# Patient Record
Sex: Female | Born: 1970 | Race: White | Hispanic: No | Marital: Married | Smoking: Former smoker
Health system: Southern US, Community
[De-identification: ages and names within clinical notes are randomized; demographics above are authoritative.]

## PROBLEM LIST (undated history)

## (undated) DIAGNOSIS — R2689 Other abnormalities of gait and mobility: Secondary | ICD-10-CM

## (undated) DIAGNOSIS — K8020 Calculus of gallbladder without cholecystitis without obstruction: Secondary | ICD-10-CM

---

## 2009-05-24 DIAGNOSIS — R2689 Other abnormalities of gait and mobility: Secondary | ICD-10-CM

## 2009-05-24 HISTORY — PX: FOOT SURGERY: SHX648

## 2009-05-24 HISTORY — DX: Other abnormalities of gait and mobility: R26.89

## 2014-03-14 ENCOUNTER — Encounter: Payer: Self-pay | Admitting: Family Medicine

## 2014-03-14 ENCOUNTER — Ambulatory Visit (INDEPENDENT_AMBULATORY_CARE_PROVIDER_SITE_OTHER): Payer: BC Managed Care – PPO | Admitting: Family Medicine

## 2014-03-14 VITALS — BP 114/74 | HR 66 | Ht 62.0 in | Wt 262.0 lb

## 2014-03-14 DIAGNOSIS — R635 Abnormal weight gain: Secondary | ICD-10-CM

## 2014-03-14 DIAGNOSIS — E669 Obesity, unspecified: Secondary | ICD-10-CM

## 2014-03-14 LAB — BASIC METABOLIC PANEL WITH GFR
BUN: 9 mg/dL (ref 6–23)
CALCIUM: 9.1 mg/dL (ref 8.4–10.5)
CHLORIDE: 102 meq/L (ref 96–112)
CO2: 29 meq/L (ref 19–32)
CREATININE: 0.61 mg/dL (ref 0.50–1.10)
GFR, Est African American: >89 mL/min
GFR, Est Non African American: >89 mL/min
Glucose, Bld: 84 mg/dL (ref 70–99)
Potassium: 4.8 mEq/L (ref 3.5–5.3)
SODIUM: 137 meq/L (ref 135–145)

## 2014-03-14 LAB — TSH: TSH: 2.049 u[IU]/mL (ref 0.350–4.500)

## 2014-03-14 MED ORDER — PHENTERMINE HCL 37.5 MG PO TABS: 37.5000 mg | ORAL_TABLET | Freq: Every day | ORAL | Status: DC

## 2014-03-14 NOTE — Patient Instructions (Signed)
Other weight loss medications include: Belviq, Qsymia, Contrave, Topamax (used only because appetite loss is the #1 side effect).

## 2014-03-14 NOTE — Progress Notes (Signed)
CC: Nancy Cannon Cannon is a 43 y.o. female is here for Establish Care   Subjective: HPI:  Pleasant 43 year old here to establish care wife of Nancy Cannon Cannon  Patient reports difficulty with losing weight over the past 3-4 years ever since a right ankle fracture left her immobilized for a few months. She's actually had some success in September citing that she's lost about 20 pounds. She has a sedentary job and has not been able to find time during the day to start a formal exercise routine. She's lost weight by using plexus.  Her mother and father is and was overweight respectively.  She's never had her thyroid function tested and she can't recall when she had blood work done last.  She reports that cravings are the major problem her difficulty losing weight.  Review of Systems - General ROS: negative for - chills, fever, night sweats, unintentional weight loss Ophthalmic ROS: negative for - decreased vision Psychological ROS: negative for - anxiety or depression ENT ROS: negative for - hearing change, nasal congestion, tinnitus or allergies Hematological and Lymphatic ROS: negative for - bleeding problems, bruising or swollen lymph nodes Breast ROS: negative Respiratory ROS: no cough, shortness of breath, or wheezing Cardiovascular ROS: no chest pain or dyspnea on exertion Gastrointestinal ROS: no abdominal pain, change in bowel habits, or black or bloody stools Genito-Urinary ROS: negative for - genital discharge, genital ulcers, incontinence or abnormal bleeding from genitals Musculoskeletal ROS: negative for - joint pain or muscle pain Neurological ROS: negative for - headaches or memory loss Dermatological ROS: negative for lumps, mole changes, rash and skin lesion changes  History reviewed. No pertinent past medical history.  Past Surgical History  Procedure Laterality Date  . Foot surgery  2011   History reviewed. No pertinent family history.  History   Social History  . Marital  Status: Married    Spouse Name: N/A    Number of Children: N/A  . Years of Education: N/A   Occupational History  . Not on file.   Social History Main Topics  . Smoking status: Never Smoker   . Smokeless tobacco: Not on file  . Alcohol Use: Yes  . Drug Use: No  . Sexual Activity: Yes    Partners: Male   Other Topics Concern  . Not on file   Social History Narrative  . No narrative on file     Objective: BP 114/74  Pulse 66  Ht 5\' 2"  (1.575 m)  Wt 262 lb (118.842 kg)  BMI 47.91 kg/m2  General: Alert and Oriented, No Acute Distress HEENT: Pupils equal, round, reactive to light. Conjunctivae clear.  Moist mucous membranes pharynx unremarkable Lungs: Clear to auscultation bilaterally, no wheezing/ronchi/rales.  Comfortable work of breathing. Good air movement. Cardiac: Regular rate and rhythm. Normal S1/S2.  No murmurs, rubs, nor gallops.   Abdomen: Obese and soft Extremities: No peripheral edema.  Strong peripheral pulses.  Mental Status: No depression, anxiety, nor agitation. Skin: Warm and dry.  Assessment & Plan: Nancy Cannon "Nancy Cannon Cannon" was seen today for establish care.  Diagnoses and associated orders for this visit:  Obesity - TSH - BASIC METABOLIC PANEL WITH GFR - phentermine (ADIPEX-P) 37.5 MG tablet; Take 1 tablet (37.5 mg total) by mouth daily before breakfast. - Ambulatory referral to diabetic education  Abnormal weight gain - TSH - BASIC METABOLIC PANEL WITH GFR - Ambulatory referral to diabetic education    Obesity: Uncontrolled ruling out metabolic abnormality and thyroid dysfunction. She expresses interest in starting phentermine and  see a nutritionist. Referral has been placed. Advised that she'll need to followup monthly for consideration of phentermine refills. Discussed possible side effects of phentermine. Also provided her with the names of other medications that she could consider using in the future if desired.   Return in about 4 weeks (around  04/11/2014) for BP Weight Check.

## 2014-04-15 ENCOUNTER — Ambulatory Visit (INDEPENDENT_AMBULATORY_CARE_PROVIDER_SITE_OTHER): Payer: BC Managed Care – PPO | Admitting: Family Medicine

## 2014-04-15 ENCOUNTER — Encounter: Payer: Self-pay | Admitting: Family Medicine

## 2014-04-15 VITALS — BP 112/78 | HR 83 | Wt 243.0 lb

## 2014-04-15 DIAGNOSIS — J309 Allergic rhinitis, unspecified: Secondary | ICD-10-CM

## 2014-04-15 DIAGNOSIS — R635 Abnormal weight gain: Secondary | ICD-10-CM

## 2014-04-15 DIAGNOSIS — E669 Obesity, unspecified: Secondary | ICD-10-CM | POA: Diagnosis not present

## 2014-04-15 MED ORDER — PHENTERMINE HCL 37.5 MG PO TABS: 37.5000 mg | ORAL_TABLET | Freq: Every day | ORAL | Status: DC

## 2014-04-15 NOTE — Progress Notes (Signed)
CC: Nancy Cannon is a 43 y.o. female is here for Obesity   Subjective: HPI:  Follow-up obesity: She is following a diet called "the whole 30 diet" that sounds like it's a diet heavily focused on reducing carbohydrates. She's been successful with over 20 pound weight loss using this diet and phentermine. No formal physical activity regimen. She denies any known side effects due to the use of phentermine. She believes providing her benefit with appetite suppression. She would like refills of possible.  Complains of nasal congestion and postnasal drip mild in severity that began yesterday. Other particular makes it better or worse. No interventions as of yet. She does think it is better this morning however. She denies fevers, chills, cough, shortness of breath  Review Of Systems Outlined In HPI  No past medical history on file.  Past Surgical History  Procedure Laterality Date  . Foot surgery  2011   Family History  Problem Relation Age of Onset  . Pancreatic cancer Father     deceased  . Hypertension Mother     History   Social History  . Marital Status: Married    Spouse Name: N/A    Number of Children: N/A  . Years of Education: N/A   Occupational History  . Not on file.   Social History Main Topics  . Smoking status: Former Smoker    Quit date: 12/12/2009  . Smokeless tobacco: Not on file  . Alcohol Use: Yes  . Drug Use: No  . Sexual Activity:    Partners: Male   Other Topics Concern  . Not on file   Social History Narrative  . No narrative on file     Objective: BP 112/78 mmHg  Pulse 83  Wt 243 lb (110.224 kg)  SpO2 97%  General: Alert and Oriented, No Acute Distress HEENT: Pupils equal, round, reactive to light. Conjunctivae clear.  External ears unremarkable, canals clear with intact TMs with appropriate landmarks.  Middle ear appears open without effusion. Pink inferior turbinates.  Moist mucous membranes, pharynx without inflammation nor  lesions.  Neck supple without palpable lymphadenopathy nor abnormal masses. Abdomen: Obese and soft Extremities: No peripheral edema.  Strong peripheral pulses.  Mental Status: No depression, anxiety, nor agitation. Skin: Warm and dry.  Assessment & Plan: Nancy Cannon was seen today for obesity.  Diagnoses and associated orders for this visit:  Abnormal weight gain  Obesity - phentermine (ADIPEX-P) 37.5 MG tablet; Take 1 tablet (37.5 mg total) by mouth daily before breakfast.  Allergic sinusitis    Obesity: Improving continue phentermine, applauded her success with dietary interventions. Discussed benefits of increasing physical activity Allergic sinusitis: Discussed antihistamines, nasal saline washes, and to call me if she begins to develop fevers, chills, night sweats.  Return in about 4 weeks (around 05/13/2014) for Nurse visit for Blood pressure weight check.

## 2014-04-25 ENCOUNTER — Encounter: Payer: Self-pay | Admitting: *Deleted

## 2014-04-25 ENCOUNTER — Encounter: Payer: BC Managed Care – PPO | Attending: Family Medicine | Admitting: *Deleted

## 2014-04-25 DIAGNOSIS — Z713 Dietary counseling and surveillance: Secondary | ICD-10-CM | POA: Insufficient documentation

## 2014-04-25 DIAGNOSIS — E669 Obesity, unspecified: Secondary | ICD-10-CM | POA: Insufficient documentation

## 2014-04-25 DIAGNOSIS — Z6841 Body Mass Index (BMI) 40.0 and over, adult: Secondary | ICD-10-CM | POA: Diagnosis not present

## 2014-04-25 NOTE — Progress Notes (Signed)
Medical Nutrition Therapy:  Appt start time: 0800 end time:  0900.  Assessment:  Primary concerns today: Nancy Cannon is here with her husband, Gerald Stabs, for nutrition counseling pertaining to weight management.  She has lost 20 pounds following the "total 30 diet"  She has cut out all sugars, starches, dairy, (most fruits except grapes by choice).  The diet sounds very similar to Paleo.  She realizes this is not sustainable.  Husband has diabetes and would like to make changes for himself.   They share grocery shopping responsibilities and cooking responsibilities.  Currently [place] the cooking and uses olive oil on the stove mostly.  She states she doesn't really bake that much.  They have totally changed their eating habits in the past 4 months.  They are from heavy families and are used to eating large portions of energy-dense foods.  They used to eat a lot of sweets, but not as much anymore.  They currently eat out 2-3 times.  She broke her foot and she had to have her foot rebuilt.  She was not able to be active for 6 months and they both gained a lot of weight during that process.   She has lost 40 pounds since September 1.   Her highest weight was 276 pounds (4 months ago) and her lowest weight as an adult was 115 pounds.  She states she has never had a consistent weight and she has yo-yoed her entire adult life.  She has done [place], exercising excessively and was on a really restrictive diet.  She would like to weigh 130.  Her mom and her sister are both heavy.  She does not know what she would weigh is she didn't diet.  Husband reports she has poor sleep habits: she wakes multiple times in the night, snores, talks in her sleep, and wakes in the morning still tired   Preferred Learning Style:   No preference indicated   Learning Readiness:   Change in progress   MEDICATIONS: phentermine   DIETARY INTAKE:  Usual eating pattern includes 1-3 meals and 1-2 snacks per day.  Everyday foods  include protein, vegetables, fats.  Avoided foods include carbohydrates.    24-hr recall:  B ( AM): grapes  Snk ( AM): almonds  L ( PM): salad (lettuce, guac, ham, oil and vinegar) Snk ( PM): grapes, almonds D ( PM): nothing Snk ( PM): not usually Beverages: water, Plexus (weight loss shake)  Usual physical activity: none currently  Estimated energy needs: 1800 calories 200 g carbohydrates 135 g protein 50 g fat    Nutritional Diagnosis:  NI-5.11.1 Predicted suboptimal nutrient intake As related to very restrictive diet.  As evidenced by dietary recall.    Intervention:  Nutrition counseling provided.  Discussed metabolic effects of dieting and discouraged this practice.   Discussed effects of pooor sleep and recommended talking with provider about sleep study.   Encouraged patient to reject traditional diet mentality of "good" vs "bad" foods.  There are no good and bad foods, but rather food is fuel that we needs for our bodies.  When we don't get enough fuel, our bodies suffer the metabolic consequences.  Encouraged patient to eat whatever foods will satisfy them, regardless of their nutritional value.  We will discuss nutritional values of foods at a subsequent appointment.  Encouraged patient to honor their body's internal hunger and fullness cues.  Throughout the day, check in mentally and rate hunger.  Try not to eat when ravenous, but instead  when slightly hungry.  Then choose food(s) that will be satisfying regardless of nutritional content.  Sit down to enjoy those foods.  Minimize distractions: turn off tv, put away books, work, Brewing technologist.  Make the meal last at least 20 minutes in order to give time to experience and register satiety.  Stop eating when full regardless of how much food is left on the plate.  Get more if still hungry.  The key is to honor fullness so throughout the meal, rate fullness factor and stop when comfortably full, but not stuffed.  Reminded patient that  they can have any food they want, whenever they want, and however much they want.  Eventually the novelty will wear out and each food will be equal in terms of its emotional appeal.  This will be a learning process and some days more food will be eaten, some days less.  The key is to honor hunger and fullness without any feelings of guilt.  Pay attention to what the internal cues are, rather than any external factors.  Spoke briefly on MyPlate for diabetes recommendations for her husband's benefit.  Suggested he ask his doctor for a referral for diabetes education  Goals: Put away the scale.  Refrain from weighing self between nutrition appointments Reject diet mentality- there are no good or bad foods Listen to internal hunger cues and honor those cues; don't wait until you're ravenous to eat Choose the food(s) you want Enjoy those foods.  Try to make meal last 20 minutes Stop eating when full.  Honor fullness cues too Do these things without any guilt or regret Talk with doctor about sleep study   Teaching Method Utilized:  Visual Auditory   Handouts given during visit include:  MyPlate for diabetes  Hunger scale  Barriers to learning/adherence to lifestyle change: diet mentality  Demonstrated degree of understanding via:  Teach Back   Monitoring/Evaluation:  Dietary intake, exercise, and body weight in 4-6 week(s).

## 2014-05-15 ENCOUNTER — Ambulatory Visit (INDEPENDENT_AMBULATORY_CARE_PROVIDER_SITE_OTHER): Payer: BC Managed Care – PPO | Admitting: Family Medicine

## 2014-05-15 VITALS — BP 114/82 | HR 70 | Wt 232.0 lb

## 2014-05-15 DIAGNOSIS — E669 Obesity, unspecified: Secondary | ICD-10-CM

## 2014-05-15 DIAGNOSIS — R635 Abnormal weight gain: Secondary | ICD-10-CM

## 2014-05-15 MED ORDER — PHENTERMINE HCL 37.5 MG PO TABS: 37.5000 mg | ORAL_TABLET | Freq: Every day | ORAL | Status: DC

## 2014-05-15 NOTE — Progress Notes (Signed)
   Subjective:    Patient ID: Nancy Cannon, female    DOB: 1970/11/24, 43 y.o.   MRN: [id number]  HPI  Jeremy is here for a weight and blood pressure check. Denies palpitations and insomnia.   Review of Systems     Objective:   Physical Exam        Assessment & Plan:  Obesity - Patient has lost weight. A refill of phentermine will be faxed to [facility]. Patient advised to schedule follow up nurse visit for blood pressure and weight check in 30 days.

## 2014-05-15 NOTE — Progress Notes (Signed)
She continues to lose weight with an acceptable blood pressure. Refills for phentermine provided.

## 2014-06-03 ENCOUNTER — Ambulatory Visit: Payer: BC Managed Care – PPO | Admitting: *Deleted

## 2014-06-13 ENCOUNTER — Ambulatory Visit (INDEPENDENT_AMBULATORY_CARE_PROVIDER_SITE_OTHER): Payer: BLUE CROSS/BLUE SHIELD | Admitting: Family Medicine

## 2014-06-13 ENCOUNTER — Other Ambulatory Visit: Payer: Self-pay | Admitting: *Deleted

## 2014-06-13 VITALS — BP 107/67 | HR 76 | Ht 62.0 in | Wt 227.0 lb

## 2014-06-13 DIAGNOSIS — E669 Obesity, unspecified: Secondary | ICD-10-CM

## 2014-06-13 DIAGNOSIS — R635 Abnormal weight gain: Secondary | ICD-10-CM

## 2014-06-13 MED ORDER — PHENTERMINE HCL 37.5 MG PO TABS: 37.5000 mg | ORAL_TABLET | Freq: Every day | ORAL | Status: DC

## 2014-06-13 NOTE — Progress Notes (Signed)
   Subjective:    Patient ID: Nancy Cannon, female    DOB: 10/10/1970, 44 y.o.   MRN: [id number]  HPI  Nancy Cannon comes to office today for medication refill. She has most recently lost 5lbs this last month. She denies CP, SOB or headaches. No other problems at this time.    Review of Systems     Objective:   Physical Exam        Assessment & Plan:

## 2014-07-15 ENCOUNTER — Encounter: Payer: Self-pay | Admitting: Family Medicine

## 2014-07-15 ENCOUNTER — Ambulatory Visit (INDEPENDENT_AMBULATORY_CARE_PROVIDER_SITE_OTHER): Payer: BLUE CROSS/BLUE SHIELD | Admitting: Family Medicine

## 2014-07-15 VITALS — BP 120/72 | HR 77 | Ht 62.0 in | Wt 215.0 lb

## 2014-07-15 DIAGNOSIS — R635 Abnormal weight gain: Secondary | ICD-10-CM | POA: Diagnosis not present

## 2014-07-15 DIAGNOSIS — E669 Obesity, unspecified: Secondary | ICD-10-CM | POA: Diagnosis not present

## 2014-07-15 MED ORDER — PHENTERMINE HCL 37.5 MG PO TABS
37.5000 mg | ORAL_TABLET | Freq: Every day | ORAL | Status: DC
Start: 2014-07-15 — End: 2014-08-13

## 2014-07-15 NOTE — Progress Notes (Signed)
CC: Nancy Cannon is a 44 y.o. female is here for Follow-up   Subjective: HPI:  Follow-up obesity: She is cutting out snacks in her diet, she is exercising on a treadmill 2-3 times a week. She feels phentermine is still helping with portion control. She denies any known side effects. Denies chest pain or irregular heartbeat, anxiety, or sleep disturbance. She feels a difference on the days that she does not take medication.  She is noticing that she is losing weight with respect to her clothing and also objectively on the scale.   Review Of Systems Outlined In HPI  No past medical history on file.  Past Surgical History  Procedure Laterality Date  . Foot surgery  2011   Family History  Problem Relation Age of Onset  . Pancreatic cancer Father     deceased  . Hypertension Mother     History   Social History  . Marital Status: Married    Spouse Name: N/A  . Number of Children: N/A  . Years of Education: N/A   Occupational History  . Not on file.   Social History Main Topics  . Smoking status: Former Smoker    Quit date: 12/12/2009  . Smokeless tobacco: Not on file  . Alcohol Use: Yes  . Drug Use: No  . Sexual Activity:    Partners: Male   Other Topics Concern  . Not on file   Social History Narrative     Objective: BP 120/72 mmHg  Pulse 77  Ht 5\' 2"  (1.575 m)  Wt 215 lb (97.523 kg)  BMI 39.31 kg/m2  General: Alert and Oriented, No Acute Distress HEENT: Pupils equal, round, reactive to light. Conjunctivae clear.  Moist mucous membranes Lungs: Clear to auscultation bilaterally, no wheezing/ronchi/rales.  Comfortable work of breathing. Good air movement. Cardiac: Regular rate and rhythm. Normal S1/S2.  No murmurs, rubs, nor gallops.   Abdomen: Obese and soft Extremities: No peripheral edema.  Strong peripheral pulses.  Mental Status: No depression, anxiety, nor agitation. Skin: Warm and dry.  Assessment & Plan: [name] was seen today for  follow-up.  Diagnoses and all orders for this visit:  Abnormal weight gain  Obesity Orders: -     phentermine (ADIPEX-P) 37.5 MG tablet; Take 1 tablet (37.5 mg total) by mouth daily before breakfast.   Obesity: Improving congratulated her success, emphasized the importance of exercising most days of the week to help keep weight off when she plateaus. Continue phentermine.  Return in about 4 weeks (around 08/12/2014).

## 2014-08-13 ENCOUNTER — Other Ambulatory Visit: Payer: Self-pay | Admitting: Family Medicine

## 2014-08-13 ENCOUNTER — Ambulatory Visit (INDEPENDENT_AMBULATORY_CARE_PROVIDER_SITE_OTHER): Payer: BLUE CROSS/BLUE SHIELD | Admitting: Family Medicine

## 2014-08-13 VITALS — BP 120/88 | HR 75 | Ht 62.0 in | Wt 209.0 lb

## 2014-08-13 DIAGNOSIS — R635 Abnormal weight gain: Secondary | ICD-10-CM | POA: Diagnosis not present

## 2014-08-13 DIAGNOSIS — E669 Obesity, unspecified: Secondary | ICD-10-CM | POA: Diagnosis not present

## 2014-08-13 MED ORDER — PHENTERMINE HCL 37.5 MG PO TABS: 37.5000 mg | ORAL_TABLET | Freq: Every day | ORAL | Status: DC

## 2014-08-13 NOTE — Progress Notes (Signed)
   Subjective:    Patient ID: Nancy Cannon, female    DOB: Sep 05, 1970, 44 y.o.   MRN: [id number]  HPI Patient is here for blood pressure and weight check. Denies any trouble sleeping, palpitations, or any other medication problems. Patient states she has not started working out at the gym yet, but has plans to. Patient has made some dietary changes along with her Rx for Phentermine.    Review of Systems     Objective:   Physical Exam        Assessment & Plan:  Patient has lost weight. A refill for Phentermine will be sent to patient preferred pharmacy. Patient advised to schedule a four week nurse visit and keep her upcoming appointment with her PCP. Verbalized understanding, no further questions.

## 2014-08-13 NOTE — Progress Notes (Signed)
Continues to lose weight, Rx refilled.

## 2014-09-13 ENCOUNTER — Ambulatory Visit (INDEPENDENT_AMBULATORY_CARE_PROVIDER_SITE_OTHER): Payer: BLUE CROSS/BLUE SHIELD | Admitting: Physician Assistant

## 2014-09-13 VITALS — BP 114/82 | HR 72 | Ht 62.0 in | Wt 198.0 lb

## 2014-09-13 DIAGNOSIS — E669 Obesity, unspecified: Secondary | ICD-10-CM | POA: Diagnosis not present

## 2014-09-13 DIAGNOSIS — I10 Essential (primary) hypertension: Secondary | ICD-10-CM

## 2014-09-13 MED ORDER — PHENTERMINE HCL 37.5 MG PO TABS: 37.5000 mg | ORAL_TABLET | Freq: Every day | ORAL | Status: DC

## 2014-09-13 NOTE — Progress Notes (Signed)
   Subjective:    Patient ID: Nancy Cannon, female    DOB: 12-02-1970, 44 y.o.   MRN: [id number]  HPI Patient is here for blood pressure and weight check. Denies any trouble sleeping, palpitations, or any other medication problems.    Review of Systems     Objective:   Physical Exam        Assessment & Plan:  Patient has lost weight. A refill for Phentermine will be sent to patient preferred pharmacy. Patient advised to schedule a four week nurse visit and keep her upcoming appointment with her PCP. Verbalized understanding, no further questions.

## 2014-09-30 ENCOUNTER — Other Ambulatory Visit: Payer: Self-pay | Admitting: Family Medicine

## 2014-10-11 ENCOUNTER — Ambulatory Visit: Payer: BLUE CROSS/BLUE SHIELD | Admitting: Family Medicine

## 2014-11-21 ENCOUNTER — Ambulatory Visit (INDEPENDENT_AMBULATORY_CARE_PROVIDER_SITE_OTHER): Payer: BLUE CROSS/BLUE SHIELD | Admitting: Family Medicine

## 2014-11-21 ENCOUNTER — Encounter: Payer: Self-pay | Admitting: Family Medicine

## 2014-11-21 VITALS — BP 102/75 | HR 60 | Wt 182.0 lb

## 2014-11-21 DIAGNOSIS — R1011 Right upper quadrant pain: Secondary | ICD-10-CM

## 2014-11-21 LAB — COMPLETE METABOLIC PANEL WITH GFR
ALBUMIN: 4 g/dL (ref 3.5–5.2)
ALK PHOS: 250 U/L — AB (ref 39–117)
ALT: 258 U/L — AB (ref 0–35)
AST: 90 U/L — AB (ref 0–37)
BILIRUBIN TOTAL: 0.5 mg/dL (ref 0.2–1.2)
BUN: 8 mg/dL (ref 6–23)
CALCIUM: 9.6 mg/dL (ref 8.4–10.5)
CO2: 26 meq/L (ref 19–32)
Chloride: 104 mEq/L (ref 96–112)
Creat: 0.59 mg/dL (ref 0.50–1.10)
GFR, Est African American: >89 mL/min
Glucose, Bld: 77 mg/dL (ref 70–99)
Potassium: 4.7 mEq/L (ref 3.5–5.3)
SODIUM: 145 meq/L (ref 135–145)
TOTAL PROTEIN: 6.9 g/dL (ref 6.0–8.3)

## 2014-11-21 LAB — GAMMA GT: GGT: 153 U/L — AB (ref 7–51)

## 2014-11-21 MED ORDER — TRAMADOL HCL 50 MG PO TABS: 50.0000 mg | ORAL_TABLET | Freq: Three times a day (TID) | ORAL | Status: AC | PRN

## 2014-11-21 NOTE — Progress Notes (Signed)
CC: Nancy Cannon is a 44 y.o. female is here for intermittent abd pain   Subjective: HPI:  Complains of right upper quadrant pain that has come on in 3 different episodes over the past month. She describes its 10 out of 10, severe, stabbing, and radiates into the right shoulder blade. It usually comes in by nausea and pain is relieved if she forces herself to throw up. If she does not for pain is present for about an hour. This usually after a big meal. It is not predictable. She's never had this before. She denies fevers, chills, diarrhea, constipation, nor skin changes. Denies difficulty breathing or pain with breathing. No cough.   Review Of Systems Outlined In HPI  No past medical history on file.  Past Surgical History  Procedure Laterality Date  . Foot surgery  2011   Family History  Problem Relation Age of Onset  . Pancreatic cancer Father     deceased  . Hypertension Mother     History   Social History  . Marital Status: Married    Spouse Name: N/A  . Number of Children: N/A  . Years of Education: N/A   Occupational History  . Not on file.   Social History Main Topics  . Smoking status: Former Smoker    Quit date: 12/12/2009  . Smokeless tobacco: Not on file  . Alcohol Use: Yes  . Drug Use: No  . Sexual Activity:    Partners: Male   Other Topics Concern  . Not on file   Social History Narrative     Objective: BP 102/75 mmHg  Pulse 60  Wt 182 lb (82.555 kg)  General: Alert and Oriented, No Acute Distress HEENT: Pupils equal, round, reactive to light. Conjunctivae clear.  Moist mucous membranes Lungs: Clear to auscultation bilaterally, no wheezing/ronchi/rales.  Comfortable work of breathing. Good air movement. Cardiac: Regular rate and rhythm. Normal S1/S2.  No murmurs, rubs, nor gallops.   Abdomen: Normal bowel sounds, soft and non tender without palpable masses. Extremities: No peripheral edema.  Strong peripheral pulses.  Mental Status: No  depression, anxiety, nor agitation. Skin: Warm and dry.  Assessment & Plan: Nancy Cannon was seen today for intermittent abd pain.  Diagnoses and all orders for this visit:  RUQ pain Orders: -     US Abdomen Complete; Future -     Gamma GT -     COMPLETE METABOLIC PANEL WITH GFR  Other orders -     traMADol (ULTRAM) 50 MG tablet; Take 1 tablet (50 mg total) by mouth every 8 (eight) hours as needed.   Right upper quadrant pain suspicious for cholelithiasis therefore pending labs and ultrasound. Discussed that avoiding fatty meals may help reduce symptoms, if symptoms return tramadol has been provided. Discussed the gallstones could come from weight loss which she has been successful with.   Return if symptoms worsen or fail to improve.

## 2014-11-22 ENCOUNTER — Ambulatory Visit (INDEPENDENT_AMBULATORY_CARE_PROVIDER_SITE_OTHER): Payer: BLUE CROSS/BLUE SHIELD

## 2014-11-22 ENCOUNTER — Telehealth: Payer: Self-pay | Admitting: Family Medicine

## 2014-11-22 DIAGNOSIS — R1011 Right upper quadrant pain: Secondary | ICD-10-CM

## 2014-11-22 DIAGNOSIS — K8020 Calculus of gallbladder without cholecystitis without obstruction: Secondary | ICD-10-CM

## 2014-11-22 DIAGNOSIS — K810 Acute cholecystitis: Secondary | ICD-10-CM

## 2014-11-22 NOTE — Telephone Encounter (Signed)
Pt.notified

## 2014-11-22 NOTE — Telephone Encounter (Signed)
Seth Bake, Will you please let patient know that her labwork was highly suggestive of gallbaldder inflammation therefore I've placed a referral to a general surgeon to talk about possibly removing her gallbladder.  I'd still recommend getting the ultrasound done on her abdomen.

## 2014-12-03 ENCOUNTER — Ambulatory Visit: Payer: Self-pay | Admitting: Family Medicine

## 2014-12-05 ENCOUNTER — Ambulatory Visit: Payer: Self-pay | Admitting: Surgery

## 2014-12-19 ENCOUNTER — Inpatient Hospital Stay (HOSPITAL_COMMUNITY)
Admission: RE | Admit: 2014-12-19 | Discharge: 2014-12-19 | Disposition: A | Discharge status: Home / Self Care | Payer: BLUE CROSS/BLUE SHIELD | Source: Ambulatory Visit

## 2014-12-19 NOTE — Progress Notes (Signed)
Pt did not show for 8:00 A.M. scheduled PAT; lvm on pt cell phone to return call.

## 2014-12-19 NOTE — Pre-Procedure Instructions (Signed)
Nancy Cannon  12/19/2014      [id number] - [place], [place] - [address] [place] [address] Phone: [phone] Fax: [phone]    Your procedure is scheduled on Tuesday, December 24, 2014  Report to [facility] Admitting at 9:45 A.M.  Call this number if you have problems the morning of surgery:  [phone]   Remember:  Do not eat food or drink liquids after midnight Monday, December 23, 2014  Take these medicines the morning of surgery with A SIP OF WATER : if needed: acetaminophen (TYLENOL) OR traMADol (ULTRAM) for pain  Stop taking  phentermine (ADIPEX-P),  Aspirin, vitamins and herbal medications. Do not take any NSAIDs ie: Ibuprofen, Advil, Naproxen or any medication containing Aspirin; STOP NOW.   Do not wear jewelry, make-up or nail polish.  Do not wear lotions, powders, or perfumes.  You may not wear deodorant.  Do not shave 48 hours prior to surgery.    Do not bring valuables to the hospital.  [facility] is not responsible for any belongings or valuables.  Contacts, dentures or bridgework may not be worn into surgery.  Leave your suitcase in the car.  After surgery it may be brought to your room.  For patients admitted to the hospital, discharge time will be determined by your treatment team.  Patients discharged the day of surgery will not be allowed to drive home.   Name and phone number of your driver:  Special instructions:  Special Instructions:Special Instructions: [facility] - Preparing for Surgery  Before surgery, you can play an important role.  Because skin is not sterile, your skin needs to be as free of germs as possible.  You can reduce the number of germs on you skin by washing with CHG (chlorahexidine gluconate) soap before surgery.  CHG is an antiseptic cleaner which kills germs and bonds with the skin to continue killing germs even after washing.  Please DO NOT use if you have an allergy to CHG  or antibacterial soaps.  If your skin becomes reddened/irritated stop using the CHG and inform your nurse when you arrive at Short Stay.  Do not shave (including legs and underarms) for at least 48 hours prior to the first CHG shower.  You may shave your face.  Please follow these instructions carefully:   1.  Shower with CHG Soap the night before surgery and the morning of Surgery.  2.  If you choose to wash your hair, wash your hair first as usual with your normal shampoo.  3.  After you shampoo, rinse your hair and body thoroughly to remove the Shampoo.  4.  Use CHG as you would any other liquid soap.  You can apply chg directly  to the skin and wash gently with scrungie or a clean washcloth.  5.  Apply the CHG Soap to your body ONLY FROM THE NECK DOWN.  Do not use on open wounds or open sores.  Avoid contact with your eyes, ears, mouth and genitals (private parts).  Wash genitals (private parts) with your normal soap.  6.  Wash thoroughly, paying special attention to the area where your surgery will be performed.  7.  Thoroughly rinse your body with warm water from the neck down.  8.  DO NOT shower/wash with your normal soap after using and rinsing off the CHG Soap.  9.  Pat yourself dry with a clean towel.  10.  Wear clean pajamas.            11.  Place clean sheets on your bed the night of your first shower and do not sleep with pets.  Day of Surgery  Do not apply any lotions/deodorants the morning of surgery.  Please wear clean clothes to the hospital/surgery center.  Please read over the following fact sheets that you were given. Pain Booklet, Coughing and Deep Breathing and Surgical Site Infection Prevention

## 2014-12-19 NOTE — Patient Instructions (Addendum)
YOUR PROCEDURE IS SCHEDULED ON :  12/24/14  REPORT TO [place] MAIN ENTRANCE FOLLOW SIGNS TO EAST ELEVATOR - GO TO 3rd FLOOR CHECK IN AT 3 EAST NURSES STATION (SHORT STAY) AT:  9:45 AM  CALL THIS NUMBER IF YOU HAVE PROBLEMS THE MORNING OF SURGERY [phone]  REMEMBER:ONLY 1 PER PERSON MAY GO TO SHORT STAY WITH YOU TO GET READY THE MORNING OF YOUR SURGERY  DO NOT EAT FOOD OR DRINK LIQUIDS AFTER MIDNIGHT  TAKE THESE MEDICINES THE MORNING OF SURGERY: NONE  STOP ASPIRIN / IBUPROFEN / ALEVE / VITAMINS / HERBAL MEDS __5__ DAYS BEFORE SURGERY  YOU MAY NOT HAVE ANY METAL ON YOUR BODY INCLUDING HAIR PINS AND PIERCING'S. DO NOT WEAR JEWELRY, MAKEUP, LOTIONS, POWDERS OR PERFUMES. DO NOT WEAR NAIL POLISH. DO NOT SHAVE 48 HRS PRIOR TO SURGERY. MEN MAY SHAVE FACE AND NECK.  DO NOT [place]. [facility] IS NOT RESPONSIBLE FOR VALUABLES.  CONTACTS, DENTURES OR PARTIALS MAY NOT BE WORN TO SURGERY. LEAVE SUITCASE IN CAR. CAN BE BROUGHT TO ROOM AFTER SURGERY.  PATIENTS DISCHARGED THE DAY OF SURGERY WILL NOT BE ALLOWED TO DRIVE HOME.  PLEASE READ OVER THE FOLLOWING INSTRUCTION SHEETS _________________________________________________________________________________                                          [facility] - PREPARING FOR SURGERY  Before surgery, you can play an important role.  Because skin is not sterile, your skin needs to be as free of germs as possible.  You can reduce the number of germs on your skin by washing with CHG (chlorahexidine gluconate) soap before surgery.  CHG is an antiseptic cleaner which kills germs and bonds with the skin to continue killing germs even after washing. Please DO NOT use if you have an allergy to CHG or antibacterial soaps.  If your skin becomes reddened/irritated stop using the CHG and inform your nurse when you arrive at Short Stay. Do not shave (including legs and underarms) for at least 48 hours prior to the  first CHG shower.  You may shave your face. Please follow these instructions carefully:   1.  Shower with CHG Soap the night before surgery and the  morning of Surgery.   2.  If you choose to wash your hair, wash your hair first as usual with your  normal  Shampoo.   3.  After you shampoo, rinse your hair and body thoroughly to remove the  shampoo.                                         4.  Use CHG as you would any other liquid soap.  You can apply chg directly  to the skin and wash . Gently wash with scrungie or clean wascloth    5.  Apply the CHG Soap to your body ONLY FROM THE NECK DOWN.   Do not use on open                           Wound or open sores. Avoid contact with eyes, ears mouth and genitals (private parts).  Genitals (private parts) with your normal soap.              6.  Wash thoroughly, paying special attention to the area where your surgery  will be performed.   7.  Thoroughly rinse your body with warm water from the neck down.   8.  DO NOT shower/wash with your normal soap after using and rinsing off  the CHG Soap .                9.  Pat yourself dry with a clean towel.             10.  Wear clean night clothes to bed after shower             11.  Place clean sheets on your bed the night of your first shower and do not  sleep with pets.  Day of Surgery : Do not apply any lotions/deodorants the morning of surgery.  Please wear clean clothes to the hospital/surgery center.  FAILURE TO FOLLOW THESE INSTRUCTIONS MAY RESULT IN THE CANCELLATION OF YOUR SURGERY    PATIENT SIGNATURE_________________________________  ______________________________________________________________________

## 2014-12-20 ENCOUNTER — Encounter (HOSPITAL_COMMUNITY)
Admission: RE | Admit: 2014-12-20 | Discharge: 2014-12-20 | Disposition: A | Discharge status: Home / Self Care | Payer: BLUE CROSS/BLUE SHIELD | Source: Ambulatory Visit | Attending: Surgery | Admitting: Surgery

## 2014-12-20 ENCOUNTER — Encounter (HOSPITAL_COMMUNITY): Payer: Self-pay

## 2014-12-20 DIAGNOSIS — Z01812 Encounter for preprocedural laboratory examination: Secondary | ICD-10-CM | POA: Diagnosis present

## 2014-12-20 DIAGNOSIS — K8020 Calculus of gallbladder without cholecystitis without obstruction: Secondary | ICD-10-CM | POA: Insufficient documentation

## 2014-12-20 HISTORY — DX: Other abnormalities of gait and mobility: R26.89

## 2014-12-20 HISTORY — DX: Calculus of gallbladder without cholecystitis without obstruction: K80.20

## 2014-12-20 LAB — BASIC METABOLIC PANEL
ANION GAP: 9 (ref 5–15)
BUN: 11 mg/dL (ref 6–20)
CALCIUM: 9.3 mg/dL (ref 8.9–10.3)
CO2: 28 mmol/L (ref 22–32)
Chloride: 104 mmol/L (ref 101–111)
Creatinine, Ser: 0.68 mg/dL (ref 0.44–1.00)
GFR calc non Af Amer: >60 mL/min (ref >60)
Glucose, Bld: 93 mg/dL (ref 65–99)
Potassium: 4.4 mmol/L (ref 3.5–5.1)
Sodium: 141 mmol/L (ref 135–145)

## 2014-12-20 LAB — CBC
HCT: 41.5 % (ref 36.0–46.0)
Hemoglobin: 13.2 g/dL (ref 12.0–15.0)
MCH: 28.9 pg (ref 26.0–34.0)
MCHC: 31.8 g/dL (ref 30.0–36.0)
MCV: 90.8 fL (ref 78.0–100.0)
Platelets: 261 10*3/uL (ref 150–400)
RBC: 4.57 MIL/uL (ref 3.87–5.11)
RDW: 13.4 % (ref 11.5–15.5)
WBC: 7.5 10*3/uL (ref 4.0–10.5)

## 2014-12-20 LAB — HCG, SERUM, QUALITATIVE: Preg, Serum: NEGATIVE

## 2014-12-23 ENCOUNTER — Encounter (HOSPITAL_COMMUNITY): Payer: Self-pay | Admitting: Surgery

## 2014-12-23 DIAGNOSIS — K8010 Calculus of gallbladder with chronic cholecystitis without obstruction: Secondary | ICD-10-CM | POA: Diagnosis present

## 2014-12-23 NOTE — H&P (Signed)
General Surgery [facility]  Nancy Cannon DOB: 07/11/1970 Married / Language: English / Race: White Female  History of Present Illness  The patient is a 44 year old female who presents for evaluation of gall stones. Patient is referred by Dr. Marcial Pacas for treatment of symptomatic cholelithiasis. Patient presents with a one-month history of intermittent right upper quadrant abdominal pain. Patient has been on a special diet and has lost approximately 100 pounds. Patient underwent abdominal ultrasound on November 22, 2014. This shows multiple shadowing gallstones. The largest stone measures 13 mm. As no biliary dilatation. Patient has no prior history of hepatobiliary or pancreatic disease. She denies jaundice or acholic stools. She denies fevers or chills. She experiences episodes of right upper quadrant pain radiating into the back associated with heavy meals, especially with eating steak. She has had nausea and has induced emesis. Patient has had no prior abdominal surgery.   Other Problems Cholelithiasis  Past Surgical History Foot Surgery Right.  Diagnostic Studies History Colonoscopy never Mammogram never Pap Smear >5 years ago  Allergies Penicillamine *ASSORTED CLASSES*  Medication History TraMADol HCl (50MG  Tablet, Oral) Active. Phentermine HCl (37.5MG  Capsule, Oral) Active. Medications Reconciled  Social History Alcohol use Occasional alcohol use. No caffeine use No drug use Tobacco use Former smoker.  Family History Hypertension Mother. Malignant Neoplasm Of Pancreas Father.  Pregnancy / Birth History Age at menarche 59 years. Contraceptive History Oral contraceptives. Gravida 0 Irregular periods Para 0  Review of Systems General Present- Weight Loss. Not Present- Appetite Loss, Chills, Fatigue, Fever, Night Sweats and Weight Gain. Skin Not Present- Change in Wart/Mole, Dryness, Hives, Jaundice, New  Lesions, Non-Healing Wounds, Rash and Ulcer. HEENT Not Present- Earache, Hearing Loss, Hoarseness, Nose Bleed, Oral Ulcers, Ringing in the Ears, Seasonal Allergies, Sinus Pain, Sore Throat, Visual Disturbances, Wears glasses/contact lenses and Yellow Eyes. Respiratory Not Present- Bloody sputum, Chronic Cough, Difficulty Breathing, Snoring and Wheezing. Breast Not Present- Breast Mass, Breast Pain, Nipple Discharge and Skin Changes. Cardiovascular Not Present- Chest Pain, Difficulty Breathing Lying Down, Leg Cramps, Palpitations, Rapid Heart Rate, Shortness of Breath and Swelling of Extremities. Gastrointestinal Present- Abdominal Pain. Not Present- Bloating, Bloody Stool, Change in Bowel Habits, Chronic diarrhea, Constipation, Difficulty Swallowing, Excessive gas, Gets full quickly at meals, Hemorrhoids, Indigestion, Nausea, Rectal Pain and Vomiting. Female Genitourinary Not Present- Frequency, Nocturia, Painful Urination, Pelvic Pain and Urgency. Musculoskeletal Not Present- Back Pain, Joint Pain, Joint Stiffness, Muscle Pain, Muscle Weakness and Swelling of Extremities. Neurological Not Present- Decreased Memory, Fainting, Headaches, Numbness, Seizures, Tingling, Tremor, Trouble walking and Weakness. Psychiatric Not Present- Anxiety, Bipolar, Change in Sleep Pattern, Depression, Fearful and Frequent crying. Endocrine Not Present- Cold Intolerance, Excessive Hunger, Hair Changes, Heat Intolerance, Hot flashes and New Diabetes. Hematology Not Present- Easy Bruising, Excessive bleeding, Gland problems, HIV and Persistent Infections.   Vitals Weight: 180 lb Height: 62in Body Surface Area: 1.89 m Body Mass Index: 32.92 kg/m Temp.: 44F(Temporal)  Pulse: 81 (Regular)  BP: 128/77 (Sitting, Left Arm, Standard)   Physical Exam  General - appears comfortable, no distress; not diaphorectic  HEENT - normocephalic; sclerae clear, gaze conjugate; mucous membranes moist, dentition good;  voice normal  Neck - symmetric on extension; no palpable anterior or posterior cervical adenopathy; no palpable masses in the thyroid bed  Chest - clear bilaterally without rhonchi, rales, or wheeze  Cor - regular rhythm with normal rate; no significant murmur  Abd - soft without distension; no surgical wound; palpation in the upper abdomen  shows no hepatosplenomegaly and no tenderness; no sign of hernia  Ext - non-tender  Neuro - grossly intact; no tremor    Assessment & Plan  CHOLELITHIASIS WITH CHRONIC CHOLECYSTITIS (574.10  K80.10)  Patient presents accompanied by her husband for evaluation for cholecystectomy for treatment of symptomatic cholelithiasis. Written literature on gallbladder surgery is provided to the patient for review at home.  Patient has symptomatic cholelithiasis. We discussed laparoscopic cholecystectomy with intraoperative cholangiography. We discussed potential complications including conversion to open surgery. We discussed her hospital stay and her postoperative recovery and return to activity. She and her husband understand and wish to proceed with surgery in the near future.  The risks and benefits of the procedure have been discussed at length with the patient. The patient understands the proposed procedure, potential alternative treatments, and the course of recovery to be expected. All of the patient's questions have been answered at this time. The patient wishes to proceed with surgery.  Earnstine Regal, MD, [facility] Office: [phone]

## 2014-12-24 ENCOUNTER — Ambulatory Visit (HOSPITAL_COMMUNITY): Payer: BLUE CROSS/BLUE SHIELD | Admitting: Anesthesiology

## 2014-12-24 ENCOUNTER — Encounter (HOSPITAL_COMMUNITY): Payer: Self-pay | Admitting: *Deleted

## 2014-12-24 ENCOUNTER — Encounter (HOSPITAL_COMMUNITY)
Admission: RE | Disposition: A | Discharge status: Home / Self Care | Payer: Self-pay | Source: Ambulatory Visit | Attending: Surgery

## 2014-12-24 ENCOUNTER — Ambulatory Visit (HOSPITAL_COMMUNITY): Payer: BLUE CROSS/BLUE SHIELD

## 2014-12-24 ENCOUNTER — Observation Stay (HOSPITAL_COMMUNITY)
Admission: RE | Admit: 2014-12-24 | Discharge: 2014-12-24 | Disposition: A | Discharge status: Home / Self Care | Payer: BLUE CROSS/BLUE SHIELD | Source: Ambulatory Visit | Attending: Surgery | Admitting: Surgery

## 2014-12-24 DIAGNOSIS — Z79899 Other long term (current) drug therapy: Secondary | ICD-10-CM | POA: Insufficient documentation

## 2014-12-24 DIAGNOSIS — Z87891 Personal history of nicotine dependence: Secondary | ICD-10-CM | POA: Diagnosis not present

## 2014-12-24 DIAGNOSIS — K8010 Calculus of gallbladder with chronic cholecystitis without obstruction: Principal | ICD-10-CM | POA: Diagnosis present

## 2014-12-24 DIAGNOSIS — Z880 Allergy status to penicillin: Secondary | ICD-10-CM | POA: Diagnosis not present

## 2014-12-24 DIAGNOSIS — R1011 Right upper quadrant pain: Secondary | ICD-10-CM | POA: Diagnosis not present

## 2014-12-24 DIAGNOSIS — Z419 Encounter for procedure for purposes other than remedying health state, unspecified: Secondary | ICD-10-CM

## 2014-12-24 HISTORY — PX: CHOLECYSTECTOMY: SHX55

## 2014-12-24 SURGERY — LAPAROSCOPIC CHOLECYSTECTOMY WITH INTRAOPERATIVE CHOLANGIOGRAM
Anesthesia: General | Site: Abdomen

## 2014-12-24 MED ORDER — FENTANYL CITRATE (PF) 100 MCG/2ML IJ SOLN
25.0000 ug | INTRAMUSCULAR | Status: DC | PRN
  Administered 2014-12-24 (×2): 50 ug via INTRAVENOUS

## 2014-12-24 MED ORDER — LACTATED RINGERS IV SOLN
INTRAVENOUS | Status: DC | PRN
  Administered 2014-12-24: 10:00:00 via INTRAVENOUS

## 2014-12-24 MED ORDER — HYDROMORPHONE HCL 1 MG/ML IJ SOLN: 1.0000 mg | INTRAMUSCULAR | Status: DC | PRN

## 2014-12-24 MED ORDER — FENTANYL CITRATE (PF) 100 MCG/2ML IJ SOLN
INTRAMUSCULAR | Status: AC
  Filled 2014-12-24: qty 2

## 2014-12-24 MED ORDER — GLYCOPYRROLATE 0.2 MG/ML IJ SOLN
INTRAMUSCULAR | Status: AC
  Filled 2014-12-24: qty 3

## 2014-12-24 MED ORDER — ONDANSETRON HCL 4 MG/2ML IJ SOLN
INTRAMUSCULAR | Status: AC
  Filled 2014-12-24: qty 2

## 2014-12-24 MED ORDER — BUPIVACAINE-EPINEPHRINE (PF) 0.5% -1:200000 IJ SOLN
INTRAMUSCULAR | Status: AC
  Filled 2014-12-24: qty 30

## 2014-12-24 MED ORDER — PROPOFOL 10 MG/ML IV BOLUS
INTRAVENOUS | Status: AC
  Filled 2014-12-24: qty 20

## 2014-12-24 MED ORDER — ROCURONIUM BROMIDE 100 MG/10ML IV SOLN
INTRAVENOUS | Status: AC
  Filled 2014-12-24: qty 1

## 2014-12-24 MED ORDER — KCL IN DEXTROSE-NACL 20-5-0.45 MEQ/L-%-% IV SOLN
INTRAVENOUS | Status: DC
  Filled 2014-12-24: qty 1000

## 2014-12-24 MED ORDER — LACTATED RINGERS IR SOLN
Status: DC | PRN
  Administered 2014-12-24: 1000 mL

## 2014-12-24 MED ORDER — FENTANYL CITRATE (PF) 100 MCG/2ML IJ SOLN
INTRAMUSCULAR | Status: DC | PRN
  Administered 2014-12-24 (×5): 50 ug via INTRAVENOUS

## 2014-12-24 MED ORDER — ONDANSETRON HCL 4 MG/2ML IJ SOLN
INTRAMUSCULAR | Status: DC | PRN
  Administered 2014-12-24: 4 mg via INTRAVENOUS

## 2014-12-24 MED ORDER — NEOSTIGMINE METHYLSULFATE 10 MG/10ML IV SOLN
INTRAVENOUS | Status: DC | PRN
  Administered 2014-12-24: 4 mg via INTRAVENOUS

## 2014-12-24 MED ORDER — ACETAMINOPHEN 325 MG PO TABS: 650.0000 mg | ORAL_TABLET | ORAL | Status: DC | PRN

## 2014-12-24 MED ORDER — GLYCOPYRROLATE 0.2 MG/ML IJ SOLN
INTRAMUSCULAR | Status: DC | PRN
  Administered 2014-12-24: .6 mg via INTRAVENOUS

## 2014-12-24 MED ORDER — IOHEXOL 300 MG/ML  SOLN
INTRAMUSCULAR | Status: DC | PRN
  Administered 2014-12-24: 15 mL

## 2014-12-24 MED ORDER — PROMETHAZINE HCL 25 MG/ML IJ SOLN
6.2500 mg | INTRAMUSCULAR | Status: DC | PRN
Start: 2014-12-24 — End: 2014-12-24
  Administered 2014-12-24: 6.25 mg via INTRAVENOUS

## 2014-12-24 MED ORDER — ROCURONIUM BROMIDE 100 MG/10ML IV SOLN
INTRAVENOUS | Status: DC | PRN
  Administered 2014-12-24: 40 mg via INTRAVENOUS

## 2014-12-24 MED ORDER — NEOSTIGMINE METHYLSULFATE 10 MG/10ML IV SOLN
INTRAVENOUS | Status: AC
  Filled 2014-12-24: qty 1

## 2014-12-24 MED ORDER — PROMETHAZINE HCL 25 MG/ML IJ SOLN
INTRAMUSCULAR | Status: AC
  Filled 2014-12-24: qty 1

## 2014-12-24 MED ORDER — ONDANSETRON HCL 4 MG/2ML IJ SOLN: 4.0000 mg | Freq: Four times a day (QID) | INTRAMUSCULAR | Status: DC | PRN

## 2014-12-24 MED ORDER — MIDAZOLAM HCL 5 MG/5ML IJ SOLN
INTRAMUSCULAR | Status: DC | PRN
  Administered 2014-12-24: 2 mg via INTRAVENOUS

## 2014-12-24 MED ORDER — TRAMADOL HCL 50 MG PO TABS: 50.0000 mg | ORAL_TABLET | Freq: Four times a day (QID) | ORAL | Status: DC | PRN

## 2014-12-24 MED ORDER — CIPROFLOXACIN IN D5W 400 MG/200ML IV SOLN
INTRAVENOUS | Status: AC
  Filled 2014-12-24: qty 200

## 2014-12-24 MED ORDER — FENTANYL CITRATE (PF) 250 MCG/5ML IJ SOLN
INTRAMUSCULAR | Status: AC
  Filled 2014-12-24: qty 25

## 2014-12-24 MED ORDER — BUPIVACAINE-EPINEPHRINE 0.5% -1:200000 IJ SOLN
INTRAMUSCULAR | Status: DC | PRN
  Administered 2014-12-24: 20 mL

## 2014-12-24 MED ORDER — 0.9 % SODIUM CHLORIDE (POUR BTL) OPTIME
TOPICAL | Status: DC | PRN
  Administered 2014-12-24: 1000 mL

## 2014-12-24 MED ORDER — HYDROCODONE-ACETAMINOPHEN 5-325 MG PO TABS: 1.0000 | ORAL_TABLET | ORAL | Status: DC | PRN

## 2014-12-24 MED ORDER — ONDANSETRON HCL 4 MG PO TABS: 4.0000 mg | ORAL_TABLET | Freq: Four times a day (QID) | ORAL | Status: DC | PRN

## 2014-12-24 MED ORDER — CIPROFLOXACIN IN D5W 400 MG/200ML IV SOLN
400.0000 mg | INTRAVENOUS | Status: AC
  Administered 2014-12-24: 400 mg via INTRAVENOUS

## 2014-12-24 MED ORDER — LIDOCAINE HCL (CARDIAC) 20 MG/ML IV SOLN
INTRAVENOUS | Status: DC | PRN
  Administered 2014-12-24: 80 mg via INTRAVENOUS

## 2014-12-24 MED ORDER — MIDAZOLAM HCL 2 MG/2ML IJ SOLN
INTRAMUSCULAR | Status: AC
  Filled 2014-12-24: qty 4

## 2014-12-24 MED ORDER — PROPOFOL 10 MG/ML IV BOLUS
INTRAVENOUS | Status: DC | PRN
  Administered 2014-12-24: 150 mg via INTRAVENOUS

## 2014-12-24 SURGICAL SUPPLY — 32 items
APPLIER CLIP ROT 10 11.4 M/L (STAPLE) ×3
BENZOIN TINCTURE PRP APPL 2/3 (GAUZE/BANDAGES/DRESSINGS) ×3 IMPLANT
CABLE HIGH FREQUENCY MONO STRZ (ELECTRODE) ×3 IMPLANT
CHLORAPREP W/TINT 26ML (MISCELLANEOUS) ×3 IMPLANT
CLIP APPLIE ROT 10 11.4 M/L (STAPLE) ×1 IMPLANT
CLOSURE WOUND 1/2 X4 (GAUZE/BANDAGES/DRESSINGS) ×1
COVER MAYO STAND STRL (DRAPES) ×3 IMPLANT
COVER SURGICAL LIGHT HANDLE (MISCELLANEOUS) ×3 IMPLANT
DECANTER SPIKE VIAL GLASS SM (MISCELLANEOUS) ×3 IMPLANT
DRAPE C-ARM 42X120 X-RAY (DRAPES) ×3 IMPLANT
DRAPE LAPAROSCOPIC ABDOMINAL (DRAPES) ×3 IMPLANT
ELECT REM PT RETURN 9FT ADLT (ELECTROSURGICAL) ×3
ELECTRODE REM PT RTRN 9FT ADLT (ELECTROSURGICAL) ×1 IMPLANT
GAUZE SPONGE 2X2 8PLY STRL LF (GAUZE/BANDAGES/DRESSINGS) ×1 IMPLANT
GLOVE SURG ORTHO 8.0 STRL STRW (GLOVE) ×3 IMPLANT
GOWN STRL REUS W/TWL XL LVL3 (GOWN DISPOSABLE) ×6 IMPLANT
HEMOSTAT SURGICEL 4X8 (HEMOSTASIS) IMPLANT
KIT BASIN OR (CUSTOM PROCEDURE TRAY) ×3 IMPLANT
POUCH SPECIMEN RETRIEVAL 10MM (ENDOMECHANICALS) ×3 IMPLANT
SCISSORS LAP 5X35 DISP (ENDOMECHANICALS) ×3 IMPLANT
SET CHOLANGIOGRAPH MIX (MISCELLANEOUS) ×3 IMPLANT
SET IRRIG TUBING LAPAROSCOPIC (IRRIGATION / IRRIGATOR) ×3 IMPLANT
SLEEVE XCEL OPT CAN 5 100 (ENDOMECHANICALS) ×3 IMPLANT
SPONGE GAUZE 2X2 STER 10/PKG (GAUZE/BANDAGES/DRESSINGS) ×2
STRIP CLOSURE SKIN 1/2X4 (GAUZE/BANDAGES/DRESSINGS) ×2 IMPLANT
SUT MNCRL AB 4-0 PS2 18 (SUTURE) ×3 IMPLANT
TAPE CLOTH SURG 6X10 WHT LF (GAUZE/BANDAGES/DRESSINGS) ×3 IMPLANT
TOWEL OR 17X26 10 PK STRL BLUE (TOWEL DISPOSABLE) ×3 IMPLANT
TRAY LAPAROSCOPIC (CUSTOM PROCEDURE TRAY) ×3 IMPLANT
TROCAR BLADELESS OPT 5 100 (ENDOMECHANICALS) ×3 IMPLANT
TROCAR XCEL BLUNT TIP 100MML (ENDOMECHANICALS) ×3 IMPLANT
TROCAR XCEL NON-BLD 11X100MML (ENDOMECHANICALS) ×3 IMPLANT

## 2014-12-24 NOTE — Transfer of Care (Signed)
Immediate Anesthesia Transfer of Care Note  Patient: Nancy Cannon  Procedure(s) Performed: Procedure(s): LAPAROSCOPIC CHOLECYSTECTOMY WITH INTRAOPERATIVE CHOLANGIOGRAM (N/A)  Patient Location: PACU  Anesthesia Type:General  Level of Consciousness: awake, alert  and oriented  Airway & Oxygen Therapy: Patient Spontanous Breathing and Patient connected to face mask oxygen  Post-op Assessment: Report given to RN and Post -op Vital signs reviewed and stable  Post vital signs: Reviewed and stable  Last Vitals:  Filed Vitals:   12/24/14 0951  BP: 118/76  Pulse: 55  Temp: 36.3 C  Resp: 16    Complications: No apparent anesthesia complications

## 2014-12-24 NOTE — Anesthesia Postprocedure Evaluation (Signed)
  Anesthesia Post-op Note  Patient: Nancy Cannon  Procedure(s) Performed: Procedure(s): LAPAROSCOPIC CHOLECYSTECTOMY WITH INTRAOPERATIVE CHOLANGIOGRAM (N/A)  Patient Location: PACU  Anesthesia Type:General  Level of Consciousness: awake  Airway and Oxygen Therapy: Patient Spontanous Breathing  Post-op Pain: mild  Post-op Assessment: Post-op Vital signs reviewed              Post-op Vital Signs: Reviewed  Last Vitals:  Filed Vitals:   12/24/14 1300  BP: 93/64  Pulse: 54  Temp:   Resp: 13    Complications: No apparent anesthesia complications

## 2014-12-24 NOTE — Anesthesia Procedure Notes (Signed)
Procedure Name: Intubation Date/Time: 12/24/2014 11:40 AM Performed by: Chyrel Masson Pre-anesthesia Checklist: Patient identified, Emergency Drugs available, Suction available, Patient being monitored and Timeout performed Oxygen Delivery Method: Circle system utilized Preoxygenation: Pre-oxygenation with 100% oxygen Intubation Type: IV induction Ventilation: Mask ventilation without difficulty Laryngoscope Size: Mac and 3 Grade View: Grade I Tube type: Oral Tube size: 7.5 mm Number of attempts: 1 Airway Equipment and Method: Stylet Placement Confirmation: ETT inserted through vocal cords under direct vision,  positive ETCO2 and breath sounds checked- equal and bilateral Secured at: 21 cm Tube secured with: Tape Dental Injury: Teeth and Oropharynx as per pre-operative assessment

## 2014-12-24 NOTE — Op Note (Signed)
Procedure Note  Pre-operative Diagnosis:  Chronic cholecystitis, cholelithiasis  Post-operative Diagnosis:  same  Surgeon:  Earnstine Regal, MD, FACS  Assistant:  Gurney Maxin, MD   Procedure:  Laparoscopic cholecystectomy with intra-operative cholangiography  Anesthesia:  General  Estimated Blood Loss:  minimal  Drains: none         Specimen: Gallbladder to pathology  Indications:  Patient is referred by Dr. Marcial Pacas for treatment of symptomatic cholelithiasis. Patient presents with a one-month history of intermittent right upper quadrant abdominal pain. Patient has been on a special diet and has lost approximately 100 pounds. Patient underwent abdominal ultrasound on November 22, 2014. This shows multiple shadowing gallstones. The largest stone measures 13 mm. As no biliary dilatation. Patient has no prior history of hepatobiliary or pancreatic disease. She denies jaundice or acholic stools. She denies fevers or chills. She experiences episodes of right upper quadrant pain radiating into the back associated with heavy meals, especially with eating steak.  Procedure Details:  The patient was seen in the pre-op holding area. The risks, benefits, complications, treatment options, and expected outcomes were previously discussed with the patient. The patient agreed with the proposed plan and has signed the informed consent form.  The patient was brought to the Operating Room, identified as CORRIE REDER and the procedure verified as laparoscopic cholecystectomy with intraoperative cholangiography. A "time out" was completed and the above information confirmed.  The patient was placed in the supine position. Following induction of general anesthesia, the abdomen was prepped and draped in the usual aseptic fashion.  An incision was made in the skin near the umbilicus. The midline fascia was incised and the peritoneal cavity was entered and a Hasson canula was introduced under direct  vision.  The Hasson canula was secured with a 0-Vicryl pursestring suture. Pneumoperitoneum was established with carbon dioxide. Additional trocars were introduced under direct vision along the right costal margin in the midline, mid-clavicular line, and anterior axillary line.   The gallbladder was identified and the fundus grasped and retracted cephalad. Adhesions were taken down bluntly and the electrocautery was utilized as needed, taking care not to injure any adjacent structures. The infundibulum was grasped and retracted laterally, exposing the peritoneum overlying the triangle of Calot. The peritoneum was incised and structures exposed with blunt dissection. The cystic duct was clearly identified, bluntly dissected circumferentially, and clipped at the neck of the gallbladder.  An incision was made in the cystic duct and the cholangiogram catheter introduced. The catheter was secured using an ligaclip.  Real-time cholangiography was performed using C-arm fluoroscopy.  There was rapid filling of a normal caliber common bile duct.  There was reflux of contrast into the left and right hepatic ductal systems.  There was free flow distally into the duodenum without filling defect or obstruction.  The catheter was removed from the peritoneal cavity.  The cystic duct was then ligated with surgical clips and divided. The cystic artery was identified, dissected circumferentially, ligated with ligaclips, and divided.  The gallbladder was dissected away from the liver bed using the electrocautery for hemostasis. The gallbladder was completely removed from the liver and placed into an endocatch bag. The gallbladder was removed in the endocatch bag through the umbilical port site and submitted to pathology for review.  The right upper quadrant was irrigated and the gallbladder bed was inspected. Hemostasis was achieved with the electrocautery.  Pneumoperitoneum was released after viewing removal of the  trocars with good hemostasis noted. The umbilical wound  was irrigated and the fascia was then closed with the pursestring suture.  Local anesthetic was infiltrated at all port sites. The skin incisions were closed with 4-0 Monocril subcuticular sutures and steri-strips and dressings were applied.  Instrument, sponge, and needle counts were correct at the conclusion of the case.  The patient was awakened from anesthesia and brought to the recovery room in stable condition.  The patient tolerated the procedure well.   Earnstine Regal, MD, [facility] Office: [phone]

## 2014-12-24 NOTE — Discharge Instructions (Signed)
°  [facility] ° °LAPAROSCOPIC SURGERY:  POST-OP INSTRUCTIONS ° °Always review your discharge instruction sheet given to you by the facility where your surgery was performed. ° °A prescription for pain medication may be given to you upon discharge.  Take your pain medication as prescribed.  If narcotic pain medicine is not needed, then you may take acetaminophen (Tylenol) or ibuprofen (Advil) as needed. ° °Take your usually prescribed medications unless otherwise directed. ° °If you need a refill on your pain medication, please contact your pharmacy.  They will contact our office to request authorization. Prescriptions will not be filled after 5 P.M. or on weekends. ° °You should follow a light diet the first few days after arrival home, such as soup and crackers or toast.  Be sure to include plenty of fluids daily. ° °Most patients will experience some swelling and bruising in the area of the incisions.  Ice packs will help.  Swelling and bruising can take several days to resolve.  ° °It is common to experience some constipation after surgery.  Increasing fluid intake and taking a stool softener (such as Colace) will usually help or prevent this problem from occurring.  A mild laxative (Milk of Magnesia or Miralax) should be taken according to package instructions if there has been no bowel movement after 48 hours. ° °You will have steri-strips and a gauze dressing over your incisions.  You may remove the gauze bandage on the second day after surgery, and you may shower at that time.  Leave your steri-strips (small skin tapes) in place directly over the incision.  These strips should remain on the skin for 5-7 days and then be removed.  You may get them wet in the shower and pat them dry. ° °Any sutures or staples will be removed at the office during your follow-up visit. ° °ACTIVITIES:  You may resume regular (light) daily activities beginning the next day - such as daily self-care, walking,  climbing stairs - gradually increasing activities as tolerated.  You may have sexual intercourse when it is comfortable.  Refrain from any heavy lifting or straining until approved by your doctor. ° °You may drive when you are no longer taking prescription pain medication, you can comfortably wear a seatbelt, and you can safely maneuver your car and apply brakes. ° °You should see your doctor in the office for a follow-up appointment approximately 2-3 weeks after your surgery.  Make sure that you call for this appointment within a day or two after you arrive home to insure a convenient appointment time. ° °WHEN TO CALL YOUR DOCTOR: °1. Fever over 101.0 °2. Inability to urinate °3. Continued bleeding from incision °4. Increased pain, redness, or drainage from the incision °5. Increasing abdominal pain ° °The clinic staff is available to answer your questions during regular business hours.  Please don’t hesitate to call and ask to speak to one of the nurses for clinical concerns.  If you have a medical emergency, go to the nearest emergency room or call 911.  A surgeon from [facility] is always on call for the hospital. ° °Gailene Youkhana M. Luken Shadowens, MD, FACS °[facility] °Office: [phone] °Toll Free:  [phone] °FAX [phone] ° °Website: [email or web address] °

## 2014-12-24 NOTE — Interval H&P Note (Signed)
History and Physical Interval Note:  12/24/2014 11:25 AM  Nancy Cannon  has presented today for surgery, with the diagnosis of Chronic cholecystitis, cholelithiasis.  The various methods of treatment have been discussed with the patient and family. After consideration of risks, benefits and other options for treatment, the patient has consented to    Procedure(s): LAPAROSCOPIC CHOLECYSTECTOMY WITH INTRAOPERATIVE CHOLANGIOGRAM (N/A) as a surgical intervention .    The patient's history has been reviewed, patient examined, no change in status, stable for surgery.  I have reviewed the patient's chart and labs.  Questions were answered to the patient's satisfaction.    Earnstine Regal, MD, [facility] Office: [place]

## 2014-12-24 NOTE — Anesthesia Preprocedure Evaluation (Addendum)
Anesthesia Evaluation  Patient identified by MRN, date of birth, ID band Patient awake    Reviewed: Allergy & Precautions, NPO status , Patient's Chart, lab work & pertinent test results  Airway Mallampati: II  TM Distance: >3 FB Neck ROM: Full    Dental   Pulmonary former smoker,  breath sounds clear to auscultation        Cardiovascular negative cardio ROS  Rhythm:Regular Rate:Normal     Neuro/Psych    GI/Hepatic negative GI ROS, Neg liver ROS,   Endo/Other  negative endocrine ROS  Renal/GU negative Renal ROS     Musculoskeletal   Abdominal   Peds  Hematology   Anesthesia Other Findings   Reproductive/Obstetrics                             Anesthesia Physical Anesthesia Plan  ASA: II  Anesthesia Plan: General   Post-op Pain Management:    Induction: Intravenous  Airway Management Planned: Oral ETT  Additional Equipment:   Intra-op Plan:   Post-operative Plan: Extubation in OR  Informed Consent: I have reviewed the patients History and Physical, chart, labs and discussed the procedure including the risks, benefits and alternatives for the proposed anesthesia with the patient or authorized representative who has indicated his/her understanding and acceptance.   Dental advisory given  Plan Discussed with: CRNA and Anesthesiologist  Anesthesia Plan Comments:         Anesthesia Quick Evaluation  

## 2014-12-25 ENCOUNTER — Encounter (HOSPITAL_COMMUNITY): Payer: Self-pay | Admitting: Surgery

## 2014-12-25 NOTE — Discharge Summary (Signed)
Physician Discharge Summary [facility]  Patient ID: Nancy Cannon MRN: [id number] DOB/AGE: April 18, 1971 44 y.o.  Admit date: 12/24/2014 Discharge date: 12/24/2014  Admission Diagnoses:  Symptomatic cholelithiasis  Discharge Diagnoses:  Principal Problem:   Cholelithiasis with chronic cholecystitis Active Problems:   Chronic cholecystitis with calculus   Discharged Condition: good  Hospital Course: patient admitted for observation after cholecystectomy.  Tolerated diet, ambulated, voided.  Prepared for discharge on the afternoon of surgery.  Consults: None  Treatments: surgery: lap chole with IOC  Discharge Exam: Blood pressure 115/76, pulse 52, temperature 98.2 F (36.8 C), temperature source Oral, resp. rate 18, height 5\' 2"  (1.575 m), weight 80.287 kg (177 lb), last menstrual period 11/05/2014, SpO2 100 %. HEENT - clear Neck - soft Chest - clear bilaterally Cor - RRR Abd - dressings dry and intact; mild tenderness  Disposition: Home  Discharge Instructions    Diet - low sodium heart healthy    Complete by:  As directed      Discharge instructions    Complete by:  As directed   [facility]  LAPAROSCOPIC SURGERY:  POST-OP INSTRUCTIONS  Always review your discharge instruction sheet given to you by the facility where your surgery was performed.  A prescription for pain medication may be given to you upon discharge.  Take your pain medication as prescribed.  If narcotic pain medicine is not needed, then you may take acetaminophen (Tylenol) or ibuprofen (Advil) as needed.  Take your usually prescribed medications unless otherwise directed.  If you need a refill on your pain medication, please contact your pharmacy.  They will contact our office to request authorization. Prescriptions will not be filled after 5 P.M. or on weekends.  You should follow a light diet the first few days after arrival home, such as soup and crackers or  toast.  Be sure to include plenty of fluids daily.  Most patients will experience some swelling and bruising in the area of the incisions.  Ice packs will help.  Swelling and bruising can take several days to resolve.   It is common to experience some constipation after surgery.  Increasing fluid intake and taking a stool softener (such as Colace) will usually help or prevent this problem from occurring.  A mild laxative (Milk of Magnesia or Miralax) should be taken according to package instructions if there has been no bowel movement after 48 hours.  You will have steri-strips and a gauze dressing over your incisions.  You may remove the gauze bandage on the second day after surgery, and you may shower at that time.  Leave your steri-strips (small skin tapes) in place directly over the incision.  These strips should remain on the skin for 5-7 days and then be removed.  You may get them wet in the shower and pat them dry.  Any sutures or staples will be removed at the office during your follow-up visit.  ACTIVITIES:  You may resume regular (light) daily activities beginning the next day - such as daily self-care, walking, climbing stairs - gradually increasing activities as tolerated.  You may have sexual intercourse when it is comfortable.  Refrain from any heavy lifting or straining until approved by your doctor.  You may drive when you are no longer taking prescription pain medication, you can comfortably wear a seatbelt, and you can safely maneuver your car and apply brakes.  You should see your doctor in the office for a follow-up appointment approximately 2-3  weeks after your surgery.  Make sure that you call for this appointment within a day or two after you arrive home to insure a convenient appointment time.  WHEN TO CALL YOUR DOCTOR: Fever over 101.0 Inability to urinate Continued bleeding from incision Increased pain, redness, or drainage from the incision Increasing abdominal  pain  The clinic staff is available to answer your questions during regular business hours.  Please don't hesitate to call and ask to speak to one of the nurses for clinical concerns.  If you have a medical emergency, go to the nearest emergency room or call 911.  A surgeon from [facility] is always on call for the hospital.  Earnstine Regal, MD, [facility] Office: [place]:  [place] [phone]  Website: [email or web address]     Increase activity slowly    Complete by:  As directed      Remove dressing in 48 hours    Complete by:  As directed             Medication List    TAKE these medications        acetaminophen 500 MG tablet  Commonly known as:  TYLENOL  Take 1,000 mg by mouth every 6 (six) hours as needed for moderate pain or headache.     phentermine 37.5 MG tablet  Commonly known as:  ADIPEX-P  Take 1 tablet (37.5 mg total) by mouth daily before breakfast.     traMADol 50 MG tablet  Commonly known as:  ULTRAM  Take 1 tablet (50 mg total) by mouth every 8 (eight) hours as needed.     traMADol 50 MG tablet  Commonly known as:  ULTRAM  Take 1-2 tablets (50-100 mg total) by mouth every 6 (six) hours as needed for moderate pain or severe pain.           Follow-up Information    Follow up with Earnstine Regal, MD. Schedule an appointment as soon as possible for a visit in 3 weeks.   Specialty:  General Surgery   Why:  For wound re-check   Contact information:   [place] [address] [phone]       Earnstine Regal, MD, [facility] Office: [phone]   Signed: Earnstine Regal 12/25/2014, 8:16 AM

## 2015-03-12 ENCOUNTER — Ambulatory Visit (INDEPENDENT_AMBULATORY_CARE_PROVIDER_SITE_OTHER): Payer: BLUE CROSS/BLUE SHIELD | Admitting: Family Medicine

## 2015-03-12 VITALS — Temp 97.7°F

## 2015-03-12 DIAGNOSIS — Z23 Encounter for immunization: Secondary | ICD-10-CM

## 2015-03-31 ENCOUNTER — Ambulatory Visit (INDEPENDENT_AMBULATORY_CARE_PROVIDER_SITE_OTHER): Payer: BLUE CROSS/BLUE SHIELD | Admitting: Family Medicine

## 2015-03-31 ENCOUNTER — Encounter: Payer: Self-pay | Admitting: Family Medicine

## 2015-03-31 VITALS — BP 127/83 | HR 52 | Wt 170.0 lb

## 2015-03-31 DIAGNOSIS — M25551 Pain in right hip: Secondary | ICD-10-CM | POA: Diagnosis not present

## 2015-03-31 LAB — POCT URINALYSIS DIPSTICK
Bilirubin, UA: NEGATIVE
Blood, UA: NEGATIVE
Glucose, UA: NEGATIVE
Ketones, UA: NEGATIVE
Leukocytes, UA: NEGATIVE
NITRITE UA: NEGATIVE
PROTEIN UA: NEGATIVE
Spec Grav, UA: 1.01
UROBILINOGEN UA: 0.2
pH, UA: 6

## 2015-03-31 MED ORDER — CIPROFLOXACIN HCL 250 MG PO TABS: ORAL_TABLET | ORAL | Status: AC

## 2015-03-31 NOTE — Progress Notes (Signed)
CC: Nancy Cannon is a 44 y.o. female is here for Pelvic Pain   Subjective: HPI:   Right lower quadrant pain that was severe yesterday and mild in severity today. It has improved with taking tramadol and or naproxen. Nothing else seems to make symptoms better or worse. She's never had this before. Nonradiating and described only as pain. She woke up with it but denies waking up because of the pain. She denies any nausea, fevers, decreased appetite, diarrhea, constipation or genitourinary complaints other than urinary frequency 2 nights ago and all day yesterday. She denies any flank pain nor decrease in appetite.   Review Of Systems Outlined In HPI  Past Medical History  Diagnosis Date  . Limp 2011    DUE TO HX OF FRACTURE HEEL AND FOOT  . Gallstones     Past Surgical History  Procedure Laterality Date  . Foot surgery  2011  . Cholecystectomy N/A 12/24/2014    Procedure: LAPAROSCOPIC CHOLECYSTECTOMY WITH INTRAOPERATIVE CHOLANGIOGRAM;  Surgeon: Armandina Gemma, MD;  Location: [facility];  Service: General;  Laterality: N/A;   Family History  Problem Relation Age of Onset  . Pancreatic cancer Father     deceased  . Hypertension Mother     Social History   Social History  . Marital Status: Married    Spouse Name: N/A  . Number of Children: N/A  . Years of Education: N/A   Occupational History  . Not on file.   Social History Main Topics  . Smoking status: Former Smoker    Quit date: 12/12/2009  . Smokeless tobacco: Not on file  . Alcohol Use: Yes     Comment: RARE  . Drug Use: No  . Sexual Activity:    Partners: Male   Other Topics Concern  . Not on file   Social History Narrative     Objective: BP 127/83 mmHg  Pulse 52  Wt 170 lb (77.111 kg)  General: Alert and Oriented, No Acute Distress HEENT: Pupils equal, round, reactive to light. Conjunctivae clear.  Moist mucous membranes Lungs: Clear to auscultation bilaterally, no wheezing/ronchi/rales.  Comfortable work of  breathing. Good air movement. Cardiac: Regular rate and rhythm. Normal S1/S2.  No murmurs, rubs, nor gallops.   Abdomen: Normal bowel sounds, soft without palpable masses or rigidity. There is no guarding or rebound tenderness. Pain is reproduced with palpation low in the right lower quadrant just beneath the pelvic brim. No pain with heel strike. Negative psoas sign Extremities: No peripheral edema.  Strong peripheral pulses.  Mental Status: No depression, anxiety, nor agitation. Skin: Warm and dry.  Assessment & Plan: Kloe was seen today for pelvic pain.  Diagnoses and all orders for this visit:  Pain in joint, pelvic region and thigh, right -     POCT Urinalysis Dipstick -     Urine culture -     ciprofloxacin (CIPRO) 250 MG tablet; Take one by mouth twice a day for five days.   Pelvic pain most likely UTI versus ovarian cyst. Start Cipro and if pain worsens or is not resolved by Wednesday notify me ASAP the next step would be a CT scan of the abdomen and pelvis   Return if symptoms worsen or fail to improve.

## 2015-04-01 LAB — URINE CULTURE
Colony Count: NO GROWTH
ORGANISM ID, BACTERIA: NO GROWTH

## 2015-04-02 ENCOUNTER — Telehealth: Payer: Self-pay | Admitting: Family Medicine

## 2015-04-02 DIAGNOSIS — R1031 Right lower quadrant pain: Secondary | ICD-10-CM

## 2015-04-02 NOTE — Telephone Encounter (Signed)
Regarding most recent result note, next step is a CT scan of the abdomen and pelvis, order has been placed, to expedite this can you ask patient to call our radiology office at [phone] to schedule this at her convenience.

## 2015-04-02 NOTE — Telephone Encounter (Signed)
Pt advised.

## 2015-04-04 ENCOUNTER — Ambulatory Visit (INDEPENDENT_AMBULATORY_CARE_PROVIDER_SITE_OTHER): Payer: BLUE CROSS/BLUE SHIELD

## 2015-04-04 ENCOUNTER — Telehealth: Payer: Self-pay | Admitting: Family Medicine

## 2015-04-04 DIAGNOSIS — R1031 Right lower quadrant pain: Secondary | ICD-10-CM

## 2015-04-04 DIAGNOSIS — R102 Pelvic and perineal pain: Secondary | ICD-10-CM

## 2015-04-04 MED ORDER — IOHEXOL 300 MG/ML  SOLN
100.0000 mL | Freq: Once | INTRAMUSCULAR | Status: AC | PRN
  Administered 2015-04-04: 100 mL via INTRAVENOUS

## 2015-04-04 NOTE — Telephone Encounter (Signed)
Will you please let patient know that her CT scan showed that her uterus might be causing her pain since it appears to be enlarged.  It doesn't look like anything dangerous is going on, but it could be that she has a blood clot or fibroid in her uterus.  These are not life threatening, but I'd recommend she have a pelvic ultrasound to determine exactly what's causing this. Order has been placed.

## 2015-04-07 ENCOUNTER — Ambulatory Visit (INDEPENDENT_AMBULATORY_CARE_PROVIDER_SITE_OTHER): Payer: BLUE CROSS/BLUE SHIELD

## 2015-04-07 ENCOUNTER — Other Ambulatory Visit: Payer: Self-pay | Admitting: Family Medicine

## 2015-04-07 DIAGNOSIS — N859 Noninflammatory disorder of uterus, unspecified: Secondary | ICD-10-CM

## 2015-04-07 DIAGNOSIS — R102 Pelvic and perineal pain: Secondary | ICD-10-CM

## 2015-04-07 NOTE — Telephone Encounter (Signed)
Pt advised.

## 2015-04-08 ENCOUNTER — Telehealth: Payer: Self-pay | Admitting: Family Medicine

## 2015-04-08 DIAGNOSIS — R102 Pelvic and perineal pain: Secondary | ICD-10-CM

## 2015-04-08 DIAGNOSIS — D259 Leiomyoma of uterus, unspecified: Secondary | ICD-10-CM | POA: Insufficient documentation

## 2015-04-08 NOTE — Telephone Encounter (Signed)
Will you please let patient know that her ultrasound also suggests that she has multiple fibroids in her uterus which could be causing her pain.  Although these are not life threatening if she's still having some pelvic discomfort I will place a referral to our OBGYN office to see if a surgical procedure is needed to remove these.

## 2015-04-08 NOTE — Telephone Encounter (Signed)
Pt advised.

## 2015-04-10 ENCOUNTER — Ambulatory Visit (INDEPENDENT_AMBULATORY_CARE_PROVIDER_SITE_OTHER): Payer: BLUE CROSS/BLUE SHIELD | Admitting: Obstetrics & Gynecology

## 2015-04-10 ENCOUNTER — Encounter: Payer: Self-pay | Admitting: Obstetrics & Gynecology

## 2015-04-10 VITALS — BP 101/74 | HR 60 | Resp 16 | Ht 62.0 in | Wt 163.0 lb

## 2015-04-10 DIAGNOSIS — Z1151 Encounter for screening for human papillomavirus (HPV): Secondary | ICD-10-CM | POA: Diagnosis not present

## 2015-04-10 DIAGNOSIS — Z01419 Encounter for gynecological examination (general) (routine) without abnormal findings: Secondary | ICD-10-CM | POA: Diagnosis not present

## 2015-04-10 DIAGNOSIS — R1900 Intra-abdominal and pelvic swelling, mass and lump, unspecified site: Secondary | ICD-10-CM

## 2015-04-10 DIAGNOSIS — Z124 Encounter for screening for malignant neoplasm of cervix: Secondary | ICD-10-CM | POA: Diagnosis not present

## 2015-04-10 DIAGNOSIS — Z0000 Encounter for general adult medical examination without abnormal findings: Secondary | ICD-10-CM

## 2015-04-10 NOTE — Progress Notes (Signed)
Subjective:    Nancy Cannon is a 44 y.o. MW P0 female who presents for an annual exam. The patient has no complaints today. She has had some RLQ pain. Her pain is generally every day, started about 2 weeks ago. She is taking Tramadol for the pain.  An u/s and CT showed degenerating fibroid. The patient is sexually active. GYN screening history: last pap: was normal. The patient wears seatbelts: yes. The patient participates in regular exercise: no. Has the patient ever been transfused or tattooed?: no. The patient reports that there is not domestic violence in her life.   Menstrual History: OB History    Gravida Para Term Preterm AB TAB SAB Ectopic Multiple Living   0 0 0 0 0 0 0 0 0 0       Menarche age: 89  No LMP recorded.    The following portions of the patient's history were reviewed and updated as appropriate: allergies, current medications, past family history, past medical history, past social history, past surgical history and problem list.  Review of Systems Pertinent items noted in HPI and remainder of comprehensive ROS otherwise negative. Married for 10 years, no dysparenia, no lube. 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 She has had her flu vaccine this season.   Objective:    BP 101/74 mmHg  Pulse 60  Resp 16  Ht 5\' 2"  (1.575 m)  Wt 163 lb (73.936 kg)  BMI 29.81 kg/m2  General Appearance:    Alert, cooperative, no distress, appears stated age  Head:    Normocephalic, without obvious abnormality, atraumatic  Eyes:    PERRL, conjunctiva/corneas clear, EOM's intact, fundi    benign, both eyes  Ears:    Normal TM's and external ear canals, both ears  Nose:   Nares normal, septum midline, mucosa normal, no drainage    or sinus tenderness  Throat:   Lips, mucosa, and tongue normal; teeth and gums normal  Neck:   Supple, symmetrical, trachea midline, no adenopathy;    thyroid:  no enlargement/tenderness/nodules; no carotid   bruit or JVD  Back:      Symmetric, no curvature, ROM normal, no CVA tenderness  Lungs:     Clear to auscultation bilaterally, respirations unlabored  Chest Wall:    No tenderness or deformity   Heart:    Regular rate and rhythm, S1 and S2 normal, no murmur, rub   or gallop  Breast Exam:    No tenderness, masses, or nipple abnormality  Abdomen:     Soft, non-tender, bowel sounds active all four quadrants,    no masses, no organomegaly  Genitalia:    Normal female without lesion, discharge or tenderness     Extremities:   Extremities normal, atraumatic, no cyanosis or edema  Pulses:   2+ and symmetric all extremities  Skin:   Skin color, texture, turgor normal, no rashes or lesions  Lymph nodes:   Cervical, supraclavicular, and axillary nodes normal  Neurologic:   CNII-XII intact, normal strength, sensation and reflexes    throughout  .    Assessment:    Healthy female exam.   Degenerating fibroid with pelvic pain/large pelvic mass   Plan:     Breast self exam technique reviewed and patient encouraged to perform self-exam monthly. Mammogram. Thin prep Pap smear. with cotesting MRI with and without contrast Refer to gyn onc

## 2015-04-14 ENCOUNTER — Ambulatory Visit (INDEPENDENT_AMBULATORY_CARE_PROVIDER_SITE_OTHER): Payer: BLUE CROSS/BLUE SHIELD

## 2015-04-14 DIAGNOSIS — R1900 Intra-abdominal and pelvic swelling, mass and lump, unspecified site: Secondary | ICD-10-CM

## 2015-04-14 DIAGNOSIS — D251 Intramural leiomyoma of uterus: Secondary | ICD-10-CM | POA: Diagnosis not present

## 2015-04-14 LAB — CYTOLOGY - PAP

## 2015-04-14 MED ORDER — GADOBENATE DIMEGLUMINE 529 MG/ML IV SOLN
15.0000 mL | Freq: Once | INTRAVENOUS | Status: AC | PRN
  Administered 2015-04-14: 15 mL via INTRAVENOUS

## 2015-04-16 ENCOUNTER — Ambulatory Visit (INDEPENDENT_AMBULATORY_CARE_PROVIDER_SITE_OTHER): Payer: BLUE CROSS/BLUE SHIELD

## 2015-04-16 DIAGNOSIS — Z0000 Encounter for general adult medical examination without abnormal findings: Secondary | ICD-10-CM

## 2015-04-16 DIAGNOSIS — R928 Other abnormal and inconclusive findings on diagnostic imaging of breast: Secondary | ICD-10-CM

## 2015-04-22 ENCOUNTER — Other Ambulatory Visit (HOSPITAL_COMMUNITY): Payer: Self-pay | Admitting: Obstetrics & Gynecology

## 2015-04-22 DIAGNOSIS — R928 Other abnormal and inconclusive findings on diagnostic imaging of breast: Secondary | ICD-10-CM

## 2015-04-28 ENCOUNTER — Ambulatory Visit
Admission: RE | Admit: 2015-04-28 | Discharge: 2015-04-28 | Disposition: A | Discharge status: Home / Self Care | Payer: BLUE CROSS/BLUE SHIELD | Source: Ambulatory Visit | Attending: Obstetrics & Gynecology | Admitting: Obstetrics & Gynecology

## 2015-04-28 ENCOUNTER — Other Ambulatory Visit (HOSPITAL_COMMUNITY): Payer: Self-pay | Admitting: Obstetrics & Gynecology

## 2015-04-28 DIAGNOSIS — R928 Other abnormal and inconclusive findings on diagnostic imaging of breast: Secondary | ICD-10-CM

## 2015-05-05 ENCOUNTER — Ambulatory Visit
Admission: RE | Admit: 2015-05-05 | Discharge: 2015-05-05 | Disposition: A | Discharge status: Home / Self Care | Payer: BLUE CROSS/BLUE SHIELD | Source: Ambulatory Visit | Attending: Obstetrics & Gynecology | Admitting: Obstetrics & Gynecology

## 2015-05-05 ENCOUNTER — Ambulatory Visit: Payer: BLUE CROSS/BLUE SHIELD | Admitting: Gynecologic Oncology

## 2015-05-05 ENCOUNTER — Other Ambulatory Visit (HOSPITAL_COMMUNITY): Payer: Self-pay | Admitting: Obstetrics & Gynecology

## 2015-05-05 DIAGNOSIS — R928 Other abnormal and inconclusive findings on diagnostic imaging of breast: Secondary | ICD-10-CM

## 2016-02-03 ENCOUNTER — Emergency Department
Admission: EM | Admit: 2016-02-03 | Discharge: 2016-02-03 | Disposition: A | Discharge status: Home / Self Care | Payer: 59 | Source: Home / Self Care | Attending: Family Medicine | Admitting: Family Medicine

## 2016-02-03 DIAGNOSIS — H15101 Unspecified episcleritis, right eye: Secondary | ICD-10-CM

## 2016-02-03 MED ORDER — DICLOFENAC SODIUM 0.1 % OP SOLN: 1.0000 [drp] | Freq: Three times a day (TID) | OPHTHALMIC | 0 refills | Status: AC

## 2016-02-03 NOTE — ED Provider Notes (Signed)
[facility]    CSN: [id number] Arrival date & time: 02/03/16  1740  First Provider Contact:  First MD Initiated Contact with Patient 02/03/16 1809        History   Chief Complaint Chief Complaint  Patient presents with  . Conjunctivitis    HPI Nancy Cannon is a 45 y.o. female.   Patient noticed redness in her right eye today without vision changes, pain, tearing, foreign body sensation, or crusting.  No symptoms in left eye.  No nasal congestion, sore throat, or URI symptoms.  She does not wear contacts.   The history is provided by the patient and the spouse.  Conjunctivitis  This is a new problem. The current episode started 6 to 12 hours ago. The problem occurs constantly. The problem has not changed since onset.Associated symptoms comments: No changes in vision. Nothing aggravates the symptoms. Nothing relieves the symptoms. Treatments tried: allergy eye drops. The treatment provided no relief.    Past Medical History:  Diagnosis Date  . Gallstones   . Limp 2011   DUE TO HX OF FRACTURE HEEL AND FOOT    Patient Active Problem List   Diagnosis Date Noted  . Uterine fibroid 04/08/2015  . Chronic cholecystitis with calculus 12/24/2014  . Cholelithiasis with chronic cholecystitis 12/23/2014  . Cholecystitis, acute 11/22/2014  . Obesity 03/14/2014  . Abnormal weight gain 03/14/2014    Past Surgical History:  Procedure Laterality Date  . CHOLECYSTECTOMY N/A 12/24/2014   Procedure: LAPAROSCOPIC CHOLECYSTECTOMY WITH INTRAOPERATIVE CHOLANGIOGRAM;  Surgeon: Armandina Gemma, MD;  Location: [facility];  Service: General;  Laterality: N/A;  . FOOT SURGERY  2011    OB History    Gravida Para Term Preterm AB Living   0 0 0 0 0 0   SAB TAB Ectopic Multiple Live Births   0 0 0 0         Home Medications    Prior to Admission medications   Medication Sig Start Date End Date Taking? Authorizing Provider  acetaminophen (TYLENOL) 500 MG tablet Take 1,000 mg by  mouth every 6 (six) hours as needed for moderate pain or headache.    Historical Provider, MD  diclofenac (VOLTAREN) 0.1 % ophthalmic solution Place 1 drop into the right eye 3 (three) times daily. 02/03/16   Kandra Nicolas, MD  naproxen (NAPROSYN) 500 MG tablet Take 500 mg by mouth as needed.    Historical Provider, MD  traMADol (ULTRAM) 50 MG tablet Take 1 tablet (50 mg total) by mouth every 8 (eight) hours as needed. Patient taking differently: Take 50 mg by mouth every 8 (eight) hours as needed for moderate pain.  11/21/14   Marcial Pacas, DO    Family History Family History  Problem Relation Age of Onset  . Hypertension Mother   . Pancreatic cancer Father     deceased  . Diabetes Maternal Grandmother   . Diabetes Paternal Grandmother     Social History Social History  Substance Use Topics  . Smoking status: Former Smoker    Quit date: 12/12/2009  . Smokeless tobacco: Never Used  . Alcohol use 0.0 oz/week     Comment: RARE     Allergies   Morphine and related and Penicillins   Review of Systems Review of Systems  All other systems reviewed and are negative.    Physical Exam Triage Vital Signs ED Triage Vitals [02/03/16 1759]  Enc Vitals Group     BP 117/79  Pulse Rate (!) 58     Resp      Temp 97.6 F (36.4 C)     Temp Source Oral     SpO2 100 %     Weight 156 lb 3.2 oz (70.9 kg)     Height 5\' 2"  (1.575 m)     Head Circumference      Peak Flow      Pain Score      Pain Loc      Pain Edu?      Excl. in [place]?    No data found.   Updated Vital Signs BP 117/79 (BP Location: Left Arm)   Pulse (!) 58   Temp 97.6 F (36.4 C) (Oral)   Ht 5\' 2"  (1.575 m)   Wt 156 lb 3.2 oz (70.9 kg)   SpO2 100%   BMI 28.57 kg/m   Visual Acuity Right Eye Distance: 20/13 Left Eye Distance: 20/20 Bilateral Distance: 20/13  Right Eye Near:   Left Eye Near:    Bilateral Near:     Physical Exam  Constitutional: She appears well-developed and well-nourished. No  distress.  HENT:  Head: Normocephalic.  Right Ear: Tympanic membrane, external ear and ear canal normal.  Left Ear: Tympanic membrane, external ear and ear canal normal.  Nose: Nose normal.  Mouth/Throat: Oropharynx is clear and moist.  Eyes: EOM are normal. Pupils are equal, round, and reactive to light. Right eye exhibits no discharge. Left eye exhibits no discharge.    Right eye superficial episcleral vessels are vasodilated.  No discharge.  No photophobia.  Fundi benign.  No swelling or tenderness of eyelids.  Lid eversion reveals no foreign body.  Neck: Neck supple.  Cardiovascular: Normal rate.   Pulmonary/Chest: Effort normal.  Lymphadenopathy:    She has no cervical adenopathy.  Neurological: She is alert.  Skin: Skin is warm and dry.  Nursing note and vitals reviewed.    UC Treatments / Results  Labs (all labs ordered are listed, but only abnormal results are displayed) Labs Reviewed - No data to display  EKG  EKG Interpretation None       Radiology No results found.  Procedures Procedures (including critical care time)  Medications Ordered in UC Medications - No data to display   Initial Impression / Assessment and Plan / UC Course  I have reviewed the triage vital signs and the nursing notes.  Pertinent labs & imaging results that were available during my care of the patient were reviewed by me and considered in my medical decision making (see chart for details).  Clinical Course   Begin diclofenac 0.1% ophth solution TID. Recommend using a lubricating eye solution such as "Refresh" tears several times daily. Followup with ophthalmologist if not improved in 6 days.      Final Clinical Impressions(s) / UC Diagnoses   Final diagnoses:  Episcleritis of right eye    New Prescriptions Discharge Medication List as of 02/03/2016  6:27 PM    START taking these medications   Details  diclofenac (VOLTAREN) 0.1 % ophthalmic solution Place 1 drop into  the right eye 3 (three) times daily., Starting Tue 02/03/2016, Print         Kandra Nicolas, MD 02/03/16 1929

## 2016-02-03 NOTE — Discharge Instructions (Signed)
Recommend using a lubricating eye solution such as "Refresh" tears several times daily.

## 2016-02-03 NOTE — ED Triage Notes (Signed)
Pt started with eye drainage and redness this am.  Right eye red.

## 2016-02-05 ENCOUNTER — Telehealth: Payer: Self-pay

## 2016-02-05 NOTE — Telephone Encounter (Signed)
Called [place], left LM to call if any questions or problems.

## 2017-10-13 IMAGING — CT CT ABD-PELV W/ CM
2 of 5 series · 14 of 32 positions shown, 19 images · IV contrast (omnipaque)
Comparison: None.

CLINICAL DATA: Right lower quadrant pain, prior cholecystectomy

EXAM:
CT ABDOMEN AND PELVIS WITH CONTRAST
TECHNIQUE: Multidetector CT imaging of the abdomen and pelvis was performed
using the standard protocol following bolus administration of
intravenous contrast.
CONTRAST:  100mL OMNIPAQUE IOHEXOL 300 MG/ML  SOLN

[Series 2: abd/pelvis with · axial · 0.74mm/px · z∈[-409,-94]mm · 6 of 89 slices shown, 11 images]
[im 13/89  soft-tissue]
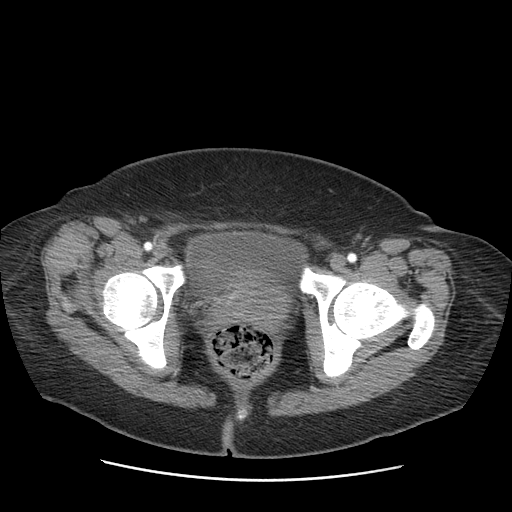
[im 13/89  bone]
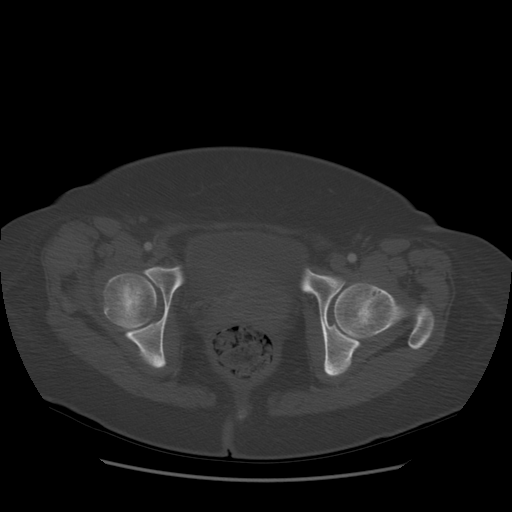
[im 26/89  soft-tissue]
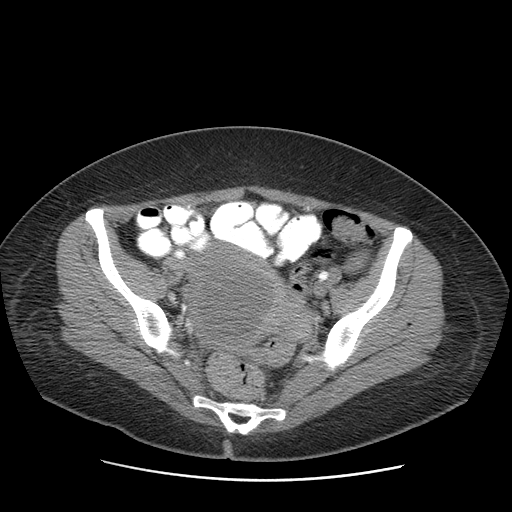
[im 38/89  soft-tissue]
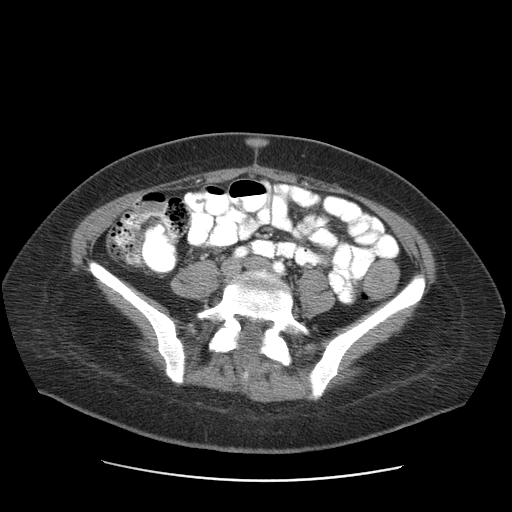
[im 38/89  lung]
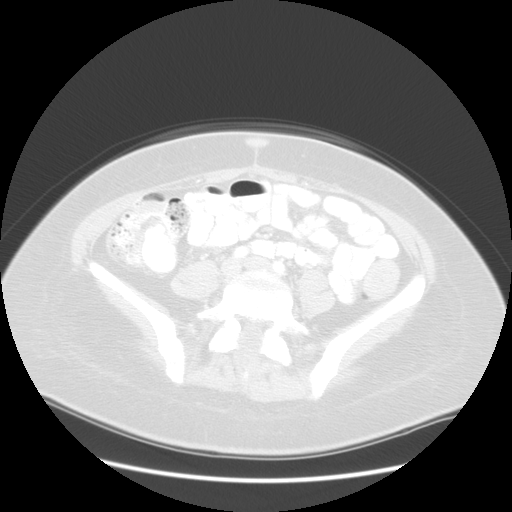
[im 51/89  soft-tissue]
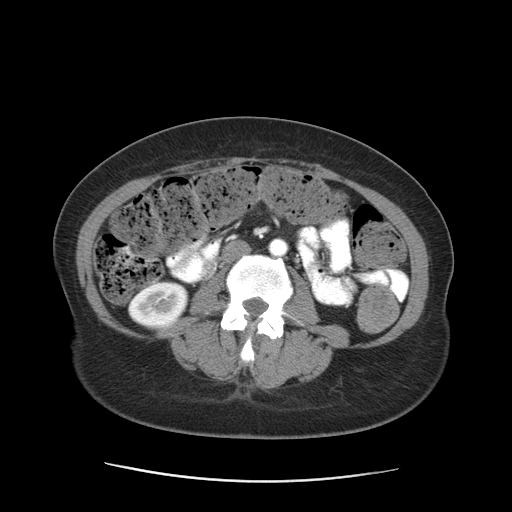
[im 51/89  lung]
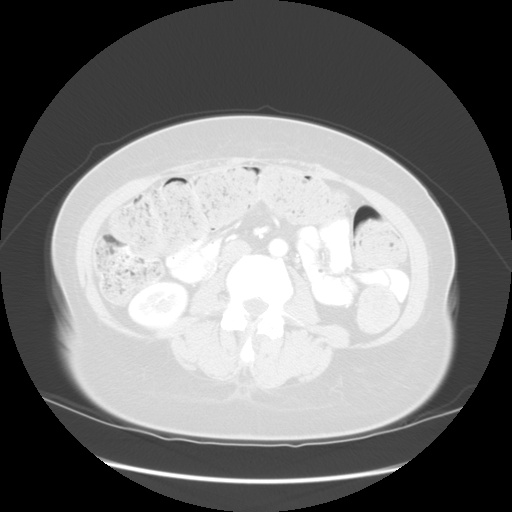
[im 63/89  soft-tissue]
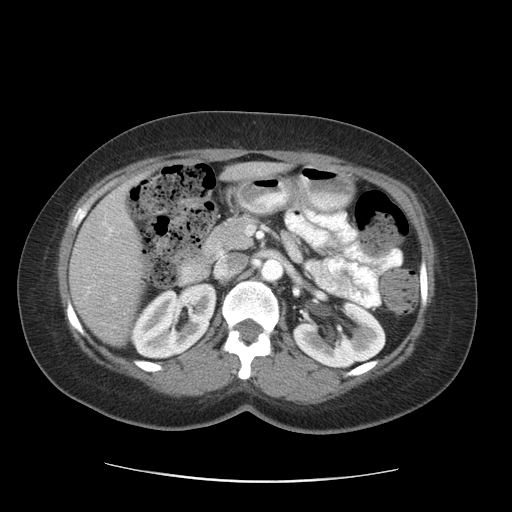
[im 63/89  lung]
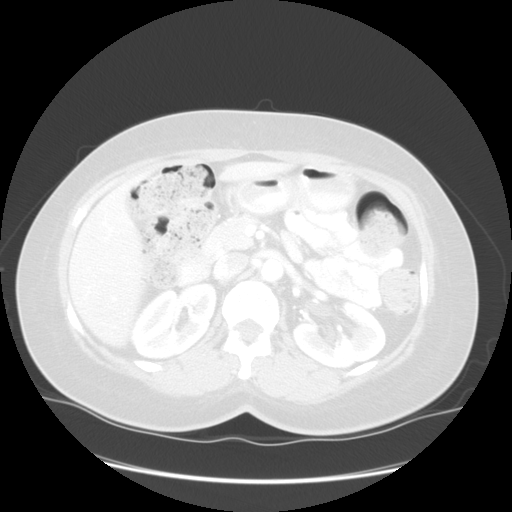
[im 76/89  soft-tissue]
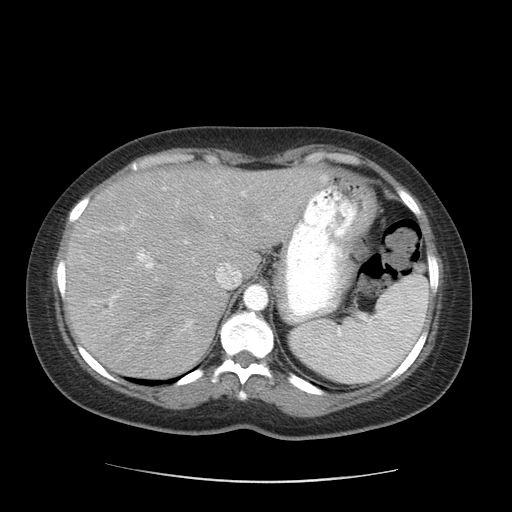
[im 76/89  lung]
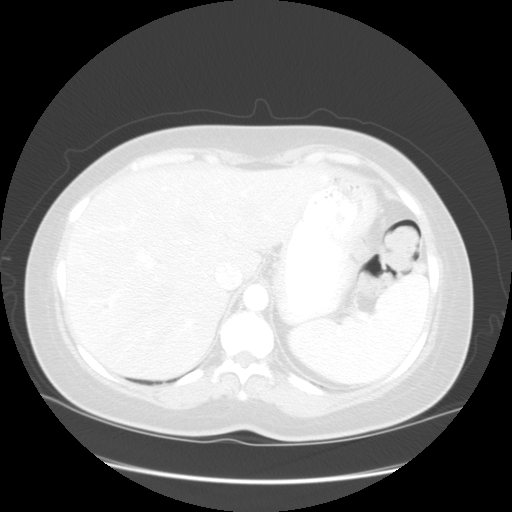

[Series 400: sag · sagittal · 0.94mm/px · 8 of 151 slices shown]
[im 14/151  soft-tissue]
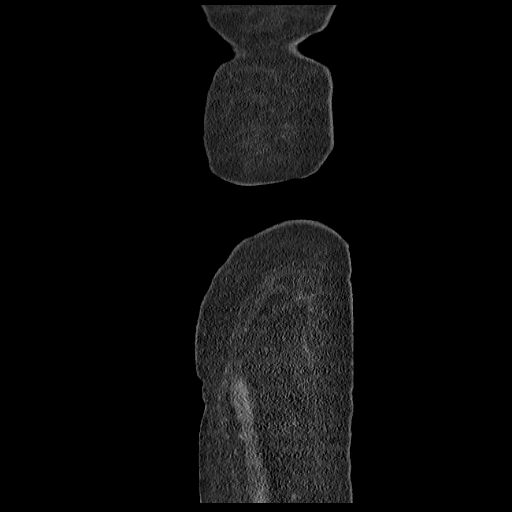
[im 28/151  soft-tissue]
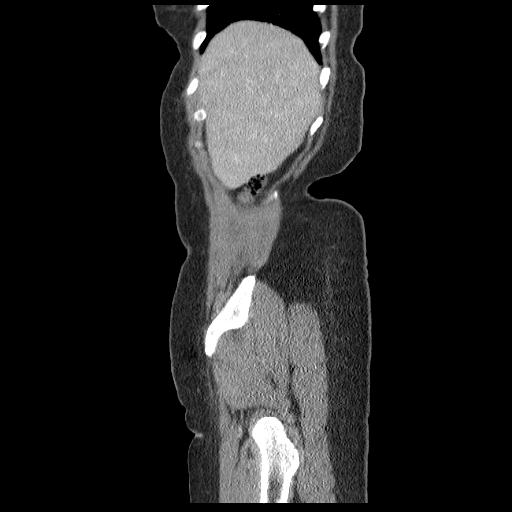
[im 55/151  soft-tissue]
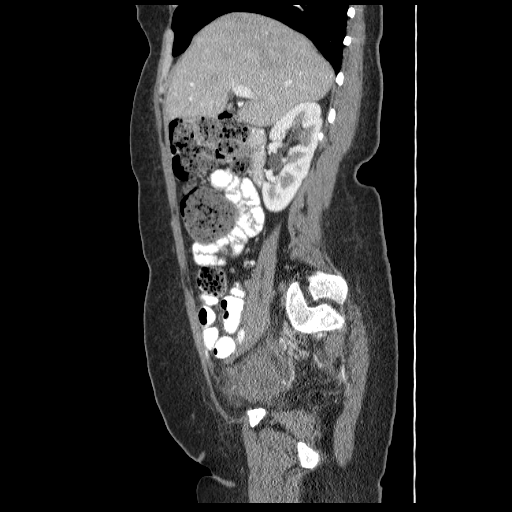
[im 69/151  soft-tissue]
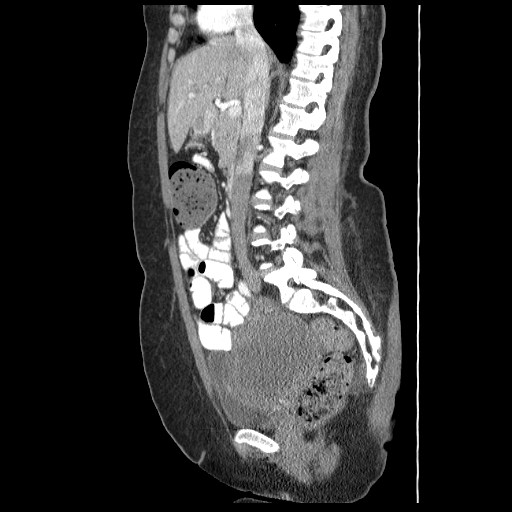
[im 82/151  soft-tissue]
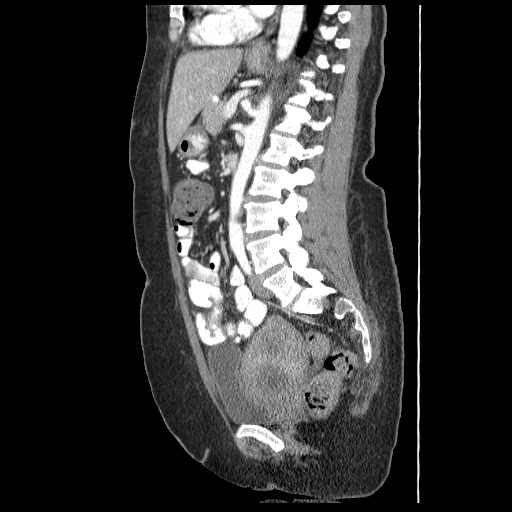
[im 96/151  soft-tissue]
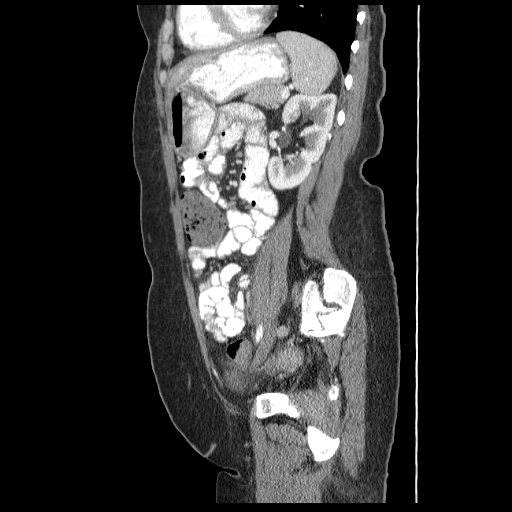
[im 123/151  soft-tissue]
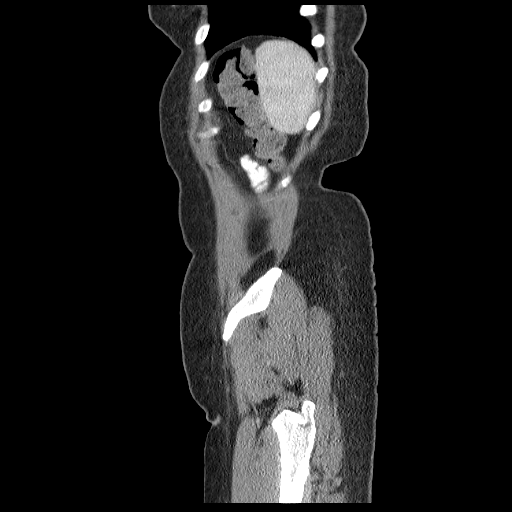
[im 137/151  soft-tissue]
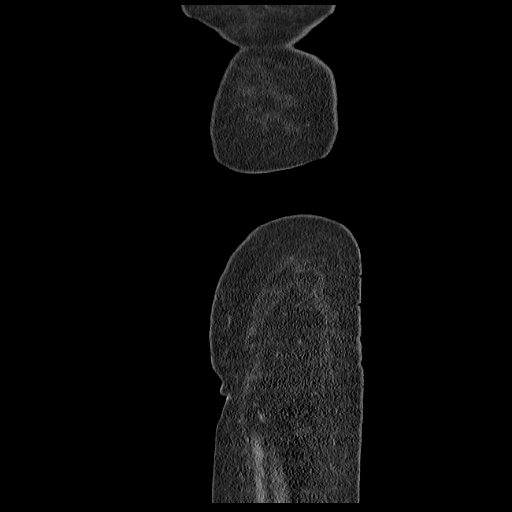

[14 of 32 positions shown; findings below may reference images not displayed]

FINDINGS: Lower chest:  Lung bases are clear.

Hepatobiliary: Two right hepatic cysts measuring up to 6 mm (series
2/image 15). No suspicious/enhancing hepatic lesions.

Status post cholecystectomy. No intrahepatic or extrahepatic ductal
dilatation.

Pancreas: Within normal limits.

Spleen: Within normal limits.

Adrenals/Urinary Tract: Adrenal glands are within normal limits.

Kidneys are within normal limits.  No hydronephrosis.

Bladder is within normal limits.

Stomach/Bowel: Stomach is within normal limits.

No evidence of bowel obstruction.

Normal appendix (series 2/ image 55).

Moderate colonic stool burden, suggesting constipation.

Vascular/Lymphatic: No evidence of abdominal aortic aneurysm.

No suspicious abdominopelvic lymphadenopathy.

Reproductive: Uterus is enlarged with central low-density measuring
approximately 30 HUs (series 2/image 70), possibly reflecting blood
products in the endometrial cavity secondary to cervical stenosis. A
large, necrotic intracavitary fibroid is also possible.

Left ovary is within normal limits. Right ovary is not discretely
visualized.

Other: Small volume pelvic ascites.

Musculoskeletal: Mild degenerative changes of the visualized
thoracolumbar spine.
IMPRESSION: No evidence of bowel obstruction.  Normal appendix.

Enlarged uterus with possible blood products in the endometrial
cavity secondary to cervical stenosis verses a large necrotic
intracavitary fibroid. Consider pelvic ultrasound for further
evaluation.

Small volume pelvic ascites.
